# Patient Record
Sex: Male | Born: 1970 | Race: White | Hispanic: No | Smoking: Former smoker
Health system: Southern US, Community
[De-identification: ages and names within clinical notes are randomized; demographics above are authoritative.]

## PROBLEM LIST (undated history)

## (undated) DIAGNOSIS — M109 Gout, unspecified: Secondary | ICD-10-CM

## (undated) HISTORY — DX: Gout, unspecified: M10.9

## (undated) HISTORY — PX: VASECTOMY: SHX75

---

## 2000-09-18 ENCOUNTER — Emergency Department (HOSPITAL_COMMUNITY): Admission: EM | Admit: 2000-09-18 | Discharge: 2000-09-18 | Payer: Self-pay | Admitting: Emergency Medicine

## 2000-10-03 ENCOUNTER — Emergency Department (HOSPITAL_COMMUNITY): Admission: EM | Admit: 2000-10-03 | Discharge: 2000-10-03 | Payer: Self-pay | Admitting: Emergency Medicine

## 2011-10-07 ENCOUNTER — Ambulatory Visit (INDEPENDENT_AMBULATORY_CARE_PROVIDER_SITE_OTHER): Payer: Managed Care, Other (non HMO) | Admitting: Emergency Medicine

## 2011-10-07 ENCOUNTER — Other Ambulatory Visit: Payer: Self-pay | Admitting: *Deleted

## 2011-10-07 VITALS — BP 118/78 | HR 84 | Temp 98.7°F | Resp 16 | Ht 70.5 in | Wt 190.2 lb

## 2011-10-07 DIAGNOSIS — M109 Gout, unspecified: Secondary | ICD-10-CM

## 2011-10-07 MED ORDER — COLCHICINE 0.6 MG PO TABS: 0.6000 mg | ORAL_TABLET | Freq: Every day | ORAL | Status: DC

## 2011-10-07 NOTE — Progress Notes (Signed)
   Patient Name: Mitchell Reed Date of Birth: Nov 12, 1970 Medical Record Number: [id number] Gender: male Date of Encounter: 10/07/2011  Chief Complaint: Gout   History of Present Illness:  Mitchell Reed is a 41 y.o. very pleasant male patient who presents with the following:  Long history of gout.  No history of injury or overuse.  Pain in left knee for past three months.  Swollen and warm. Pain with ambulation and flexion.  Denies other joint pain.  Currently off meds.  There is no problem list on file for this patient.  No past medical history on file. No past surgical history on file. History  Substance Use Topics  . Smoking status: Former Games developer  . Smokeless tobacco: Not on file  . Alcohol Use: Not on file   No family history on file. No Known Allergies  Medication list has been reviewed and updated.  No current outpatient prescriptions on file prior to visit.    Review of Systems:   GEN: WDWN, NAD, Non-toxic, Alert & Oriented x 3 HEENT: Atraumatic, Normocephalic.  Ears and Nose: No external deformity. EXTR: No clubbing/cyanosis/edema NEURO: Normal gait.  PSYCH: Normally interactive. Conversant. Not depressed or anxious appearing.  Calm demeanor.  Knee:   Warm and tender, guards full extension and flexion.  Antalgic limp.  Physical Examination: Filed Vitals:   10/07/11 1227  BP: 118/78  Pulse: 84  Temp: 98.7 F (37.1 C)  Resp: 16   Filed Vitals:   10/07/11 1227  Height: 5' 10.5" (1.791 m)  Weight: 190 lb 3.2 oz (86.274 kg)   Body mass index is 26.91 kg/(m^2). Ideal Body Weight: Weight in (lb) to have BMI = 25: 176.4   As per HPI, otherwise negative.    EKG / Labs / Xrays: None available at time of encounter  Assessment and Plan: Exacerbation gout Follow up as needed  RX colcrys  Carmelina Dane, MD

## 2011-10-07 NOTE — Patient Instructions (Signed)
Gout Gout is an inflammatory condition (arthritis) caused by a buildup of uric acid crystals in the joints. Uric acid is a chemical that is normally present in the blood. Under some circumstances, uric acid can form into crystals in your joints. This causes joint redness, soreness, and swelling (inflammation). Repeat attacks are common. Over time, uric acid crystals can form into masses (tophi) near a joint, causing disfigurement. Gout is treatable and often preventable. CAUSES  The disease begins with elevated levels of uric acid in the blood. Uric acid is produced by your body when it breaks down a naturally found substance called purines. This also happens when you eat certain foods such as meats and fish. Causes of an elevated uric acid level include:  Being passed down from parent to child (heredity).   Diseases that cause increased uric acid production (obesity, psoriasis, some cancers).   Excessive alcohol use.   Diet, especially diets rich in meat and seafood.   Medicines, including certain cancer-fighting drugs (chemotherapy), diuretics, and aspirin.   Chronic kidney disease. The kidneys are no longer able to remove uric acid well.   Problems with metabolism.  Conditions strongly associated with gout include:  Obesity.   High blood pressure.   High cholesterol.   Diabetes.  Not everyone with elevated uric acid levels gets gout. It is not understood why some people get gout and others do not. Surgery, joint injury, and eating too much of certain foods are some of the factors that can lead to gout. SYMPTOMS   An attack of gout comes on quickly. It causes intense pain with redness, swelling, and warmth in a joint.   Fever can occur.   Often, only one joint is involved. Certain joints are more commonly involved:   Base of the big toe.   Knee.   Ankle.   Wrist.   Finger.  Without treatment, an attack usually goes away in a few days to weeks. Between attacks, you  usually will not have symptoms, which is different from many other forms of arthritis. DIAGNOSIS  Your caregiver will suspect gout based on your symptoms and exam. Removal of fluid from the joint (arthrocentesis) is done to check for uric acid crystals. Your caregiver will give you a medicine that numbs the area (local anesthetic) and use a needle to remove joint fluid for exam. Gout is confirmed when uric acid crystals are seen in joint fluid, using a special microscope. Sometimes, blood, urine, and X-ray tests are also used. TREATMENT  There are 2 phases to gout treatment: treating the sudden onset (acute) attack and preventing attacks (prophylaxis). Treatment of an Acute Attack  Medicines are used. These include anti-inflammatory medicines or steroid medicines.   An injection of steroid medicine into the affected joint is sometimes necessary.   The painful joint is rested. Movement can worsen the arthritis.   You may use warm or cold treatments on painful joints, depending which works best for you.   Discuss the use of coffee, vitamin C, or cherries with your caregiver. These may be helpful treatment options.  Treatment to Prevent Attacks After the acute attack subsides, your caregiver may advise prophylactic medicine. These medicines either help your kidneys eliminate uric acid from your body or decrease your uric acid production. You may need to stay on these medicines for a very long time. The early phase of treatment with prophylactic medicine can be associated with an increase in acute gout attacks. For this reason, during the first few months   of treatment, your caregiver may also advise you to take medicines usually used for acute gout treatment. Be sure you understand your caregiver's directions. You should also discuss dietary treatment with your caregiver. Certain foods such as meats and fish can increase uric acid levels. Other foods such as dairy can decrease levels. Your caregiver  can give you a list of foods to avoid. HOME CARE INSTRUCTIONS   Do not take aspirin to relieve pain. This raises uric acid levels.   Only take over-the-counter or prescription medicines for pain, discomfort, or fever as directed by your caregiver.   Rest the joint as much as possible. When in bed, keep sheets and blankets off painful areas.   Keep the affected joint raised (elevated).   Use crutches if the painful joint is in your leg.   Drink enough water and fluids to keep your urine clear or pale yellow. This helps your body get rid of uric acid. Do not drink alcoholic beverages. They slow the passage of uric acid.   Follow your caregiver's dietary instructions. Pay careful attention to the amount of protein you eat. Your daily diet should emphasize fruits, vegetables, whole grains, and fat-free or low-fat milk products.   Maintain a healthy body weight.  SEEK MEDICAL CARE IF:   You have an oral temperature above 102 F (38.9 C).   You develop diarrhea, vomiting, or any side effects from medicines.   You do not feel better in 24 hours, or you are getting worse.  SEEK IMMEDIATE MEDICAL CARE IF:   Your joint becomes suddenly more tender and you have:   Chills.   An oral temperature above 102 F (38.9 C), not controlled by medicine.  MAKE SURE YOU:   Understand these instructions.   Will watch your condition.   Will get help right away if you are not doing well or get worse.  Document Released: 03/17/2000 Document Revised: 03/09/2011 Document Reviewed: 06/28/2009 [employer] Patient Information 2012 [employer]. 

## 2012-01-15 ENCOUNTER — Telehealth: Payer: Self-pay

## 2012-01-15 DIAGNOSIS — M109 Gout, unspecified: Secondary | ICD-10-CM

## 2012-01-15 MED ORDER — COLCHICINE 0.6 MG PO TABS: 0.6000 mg | ORAL_TABLET | Freq: Every day | ORAL | Status: DC

## 2012-01-15 NOTE — Telephone Encounter (Signed)
Pt notified that rx was sent in 

## 2012-01-15 NOTE — Telephone Encounter (Signed)
Refill sent in

## 2012-01-15 NOTE — Telephone Encounter (Signed)
Pt needs RF of colchicine please

## 2012-03-06 ENCOUNTER — Encounter: Payer: Self-pay | Admitting: Family Medicine

## 2012-03-06 ENCOUNTER — Ambulatory Visit (INDEPENDENT_AMBULATORY_CARE_PROVIDER_SITE_OTHER): Payer: Managed Care, Other (non HMO) | Admitting: Family Medicine

## 2012-03-06 VITALS — BP 132/88 | HR 96 | Temp 99.2°F | Resp 16 | Ht 70.0 in | Wt 193.4 lb

## 2012-03-06 DIAGNOSIS — L02419 Cutaneous abscess of limb, unspecified: Secondary | ICD-10-CM

## 2012-03-06 DIAGNOSIS — M79606 Pain in leg, unspecified: Secondary | ICD-10-CM

## 2012-03-06 DIAGNOSIS — L02416 Cutaneous abscess of left lower limb: Secondary | ICD-10-CM

## 2012-03-06 DIAGNOSIS — M79609 Pain in unspecified limb: Secondary | ICD-10-CM

## 2012-03-06 MED ORDER — DOXYCYCLINE HYCLATE 100 MG PO TABS: 100.0000 mg | ORAL_TABLET | Freq: Two times a day (BID) | ORAL | Status: DC

## 2012-03-06 NOTE — Progress Notes (Signed)
Procedure Note: Verbal consent obtained.  Local anesthesia with 3 cc 2% lidocaine.  Sterile prep and drape.  Incision with 11 blade.  Moderate amount of purulence expressed.  Wound irrigated with remaining anesthetic.  Packed with 1/4" inch plain packing.  Cleansed and dressed.  Pt tolerated well.

## 2012-03-06 NOTE — Progress Notes (Signed)
41 yo man with abscess on left thigh, now draining bloody fluid.  He has three daughters who have also had abscesses.  Objective:  NAD Swollen 6 cm medial mid left thigh with intense erythema and opening centrally with thick yellow pus which was cultured.  2/3 of medial thigh is mildly erythematous in a circumferential distribution around the abscess.  Assessment: Large left thigh abscess with surrounding cellulitis. Most likely this represents MRSA infection.  Plan: 1. Abscess of left thigh  Wound culture, doxycycline (VIBRA-TABS) 100 MG tablet  2. Leg pain

## 2012-03-08 ENCOUNTER — Ambulatory Visit (INDEPENDENT_AMBULATORY_CARE_PROVIDER_SITE_OTHER): Payer: Managed Care, Other (non HMO) | Admitting: Physician Assistant

## 2012-03-08 VITALS — BP 110/74 | HR 84 | Temp 98.5°F | Resp 16 | Ht 72.0 in | Wt 188.0 lb

## 2012-03-08 DIAGNOSIS — M109 Gout, unspecified: Secondary | ICD-10-CM | POA: Insufficient documentation

## 2012-03-08 DIAGNOSIS — IMO0002 Reserved for concepts with insufficient information to code with codable children: Secondary | ICD-10-CM

## 2012-03-08 DIAGNOSIS — L03119 Cellulitis of unspecified part of limb: Secondary | ICD-10-CM

## 2012-03-08 NOTE — Patient Instructions (Signed)
Continue the antibiotic and warm compresses to the area for 15-20 minutes 2-3 times daily.  Change the dressing if it becomes saturated or leaks.  Drink at least 64 ounces of water daily. Consider a humidifier for the room where you sleep. Bathe once daily. Avoid using HOT water, as it dries skin. Avoid deodorant soaps (Dial is the worst!) and stick with gentle cleansers (I like Cetaphil Liquid Cleanser). After bathing, dry off completely, then apply a thick emollient cream (I like Cetaphil Moisturizing Cream). Apply the cream twice daily, or more!

## 2012-03-08 NOTE — Progress Notes (Signed)
  Subjective:    Patient ID: Mitchell Reed, male    DOB: 07-Mar-1971, 41 y.o.   MRN: [id number]  HPI This 41 y.o. male presents for evaluation of cellulitis/abscess of the upper/inner thigh, S/P I&D on 03/06/2012.  Feels A LOT better since then.  "I can actually walk and stuff now."  No fever, chills.  Tolerating Doxy without difficulty.  He's very anxious about the wound care today, as it was very painful to have the initial I&D.  Review of Systems As above.    Objective:   Physical Exam BP 110/74  Pulse 84  Temp 98.5 F (36.9 C) (Oral)  Resp 16  Ht 6' (1.829 m)  Wt 188 lb (85.276 kg)  BMI 25.50 kg/m2  SpO2 99% WDWNWM, A&O x 3. Wound on the left inner upper thigh with packing.  Surrounding erythema and tenderness, though less than at his previous visit.  Packing removed.  No additional purulence expressed.  Large amount of necrotic tissue removed with forceps after irrigation of the wound with 2% lidocaine plain.  The wound cavity was then gently repacked with 1/2 inch plain packing and dressed.    Assessment & Plan:   1. Abscess or cellulitis of thigh    Patient Instructions  Continue the antibiotic and warm compresses to the area for 15-20 minutes 2-3 times daily.  Change the dressing if it becomes saturated or leaks.

## 2012-03-09 ENCOUNTER — Ambulatory Visit (INDEPENDENT_AMBULATORY_CARE_PROVIDER_SITE_OTHER): Payer: Managed Care, Other (non HMO) | Admitting: Physician Assistant

## 2012-03-09 VITALS — BP 120/78 | HR 90 | Temp 97.7°F | Resp 16 | Ht 71.0 in | Wt 187.0 lb

## 2012-03-09 DIAGNOSIS — L02419 Cutaneous abscess of limb, unspecified: Secondary | ICD-10-CM

## 2012-03-09 LAB — WOUND CULTURE

## 2012-03-09 NOTE — Progress Notes (Signed)
   Patient ID: Carzell Saldivar MRN: [id number], DOB: 29-Dec-1970 41 y.o. Date of Encounter: 03/09/2012, 10:22 AM  Primary Physician: No primary provider on file.  Chief Complaint: Wound care   See previous note  HPI: 41 y.o. y/o male presents for wound care s/p I&D on 03/06/12 Doing well No issues or complaints Afebrile/ no chills No nausea or vomiting Tolerating Doxycyline Pain much improved Daily dressing change Previous note reviewed  Past Medical History  Diagnosis Date  . Gout      Home Meds: Prior to Admission medications   Medication Sig Start Date End Date Taking? Authorizing Provider  colchicine 0.6 MG tablet Take 1 tablet (0.6 mg total) by mouth daily. Take two tablets now, repeat one tablet in one hour.  Tomorrow one daily. 01/15/12 01/14/13 Yes Deveon Kisiel M Karstyn Birkey, PA-C  doxycycline (VIBRA-TABS) 100 MG tablet Take 1 tablet (100 mg total) by mouth 2 (two) times daily. 03/06/12  Yes Elvina Sidle, MD    Allergies: No Known Allergies  ROS: Constitutional: Afebrile, no chills Cardiovascular: negative for chest pain or palpitations Dermatological: Positive for wound. Negative for pain, or warmth  GI: No nausea or vomiting   EXAM: Physical Exam: Blood pressure 120/78, pulse 90, temperature 97.7 F (36.5 C), resp. rate 16, height 5\' 11"  (1.803 m), weight 187 lb (84.823 kg), SpO2 100.00%., Body mass index is 26.08 kg/(m^2). General: Well developed, well nourished, in no acute distress. Nontoxic appearing. Head: Normocephalic, atraumatic, sclera non-icteric.  Neck: Supple. Lungs: Breathing is unlabored. Heart: Normal rate. Skin:  Warm and moist. Dressing and packing in place. Mild induration and erythema. No tenderness to palpation. Neuro: Alert and oriented X 3. Moves all extremities spontaneously. Normal gait.  Psych:  Responds to questions appropriately with a normal affect.   PROCEDURE: Dressing and packing removed. No purulence expressed Wound bed healthy Irrigated  with 1% plain lidocaine 5 cc. Repacked with 1/4 inch plain packing Dressing applied  LAB: Culture: MRSA sensitive to Doxycycline.  A/P: 41 y.o. y/o male with cellulitis/abscess as above s/p I&D on 03/06/12 -Wound care per above -Continue Doxycycline -Notified patient of culture results -Luke warm showers -Do not reuse towels or rags -Lotion -Pain well controlled -Daily dressing changes -Recheck 48 hours  Signed, Eula Listen, PA-C 03/09/2012 10:22 AM

## 2012-03-11 ENCOUNTER — Ambulatory Visit (INDEPENDENT_AMBULATORY_CARE_PROVIDER_SITE_OTHER): Payer: Managed Care, Other (non HMO) | Admitting: Physician Assistant

## 2012-03-11 VITALS — BP 120/81 | HR 105 | Temp 98.5°F | Resp 16

## 2012-03-11 DIAGNOSIS — L02419 Cutaneous abscess of limb, unspecified: Secondary | ICD-10-CM

## 2012-03-11 DIAGNOSIS — L03119 Cellulitis of unspecified part of limb: Secondary | ICD-10-CM

## 2012-03-11 NOTE — Progress Notes (Signed)
  Subjective:    Patient ID: Mitchell Reed, male    DOB: [birth year], 41 y.o.   MRN: [id number]  HPI  This 41 y.o. male presents for evaluation of cellulitis/abscess of the upper/inner thigh, S/P I&D on 03/06/2012, with wound care on 12/06, 12/07.  Continues to improve.  No pain now, just itching.   No fever, chills.  Tolerating Doxy without difficulty.    Review of Systems As above.    Objective:   Physical Exam BP 120/81  Pulse 105  Temp 98.5 F (36.9 C) (Oral)  Resp 16  SpO2 98%  Wound on the left inner upper thigh with packing.  Surrounding erythema and induration are minimal. Packing removed.  No additional purulence expressed.  Irrigation of the wound with 2% lidocaine plain.  The wound cavity was then gently repacked with 1/4 inch plain packing and dressed.    Assessment & Plan:   1. Cellulitis and abscess of leg    RTC 48 hours.  Patient Instructions  Continue the antibiotic and warm compresses to the area for 15-20 minutes 2-3 times daily.  Change the dressing if it becomes saturated or leaks.

## 2012-03-13 ENCOUNTER — Ambulatory Visit (INDEPENDENT_AMBULATORY_CARE_PROVIDER_SITE_OTHER): Payer: Managed Care, Other (non HMO) | Admitting: Physician Assistant

## 2012-03-13 VITALS — BP 131/86 | HR 114 | Temp 98.2°F | Resp 16 | Ht 71.0 in | Wt 184.0 lb

## 2012-03-13 DIAGNOSIS — L02419 Cutaneous abscess of limb, unspecified: Secondary | ICD-10-CM

## 2012-03-13 NOTE — Patient Instructions (Signed)
Complete the antibiotic.  Change the dressing daily, and as needed.  You should wash the wound daily with soap and water, while you are in the bath or shower is fine.  You may apply a thin layer of antibiotic ointment on the area if you choose.

## 2012-03-13 NOTE — Progress Notes (Signed)
  Subjective:    Patient ID: Mitchell Reed, male    DOB: 03/18/1971, 41 y.o.   MRN: [id number]  HPI  This 41 y.o. male presents for evaluation of cellulitis/abscess of the upper/inner thigh, S/P I&D on 03/06/2012, with wound care on 12/06, 12/07 and 12/09.  Continues to improve.  No pain now, just itching.   No fever, chills.  Tolerating Doxy without difficulty.    Review of Systems As above.    Objective:   Physical Exam BP 131/86  Pulse 114  Temp 98.2 F (36.8 C) (Oral)  Resp 16  Ht 5\' 11"  (1.803 m)  Wt 184 lb (83.462 kg)  BMI 25.66 kg/m2  SpO2 98%  Wound on the left inner upper thigh with packing.  No erythema. Surrounding induration is minimal. Packing removed.  No additional purulence expressed.  The wound cavity is very shallow and is granulating well. Bandage applied.    Assessment & Plan:   1. Cellulitis and abscess of leg    Patient Instructions  Complete the antibiotic.  Change the dressing daily, and as needed.  You should wash the wound daily with soap and water, while you are in the bath or shower is fine.  You may apply a thin layer of antibiotic ointment on the area if you choose.   RTC PRN.

## 2012-11-25 ENCOUNTER — Other Ambulatory Visit: Payer: Self-pay | Admitting: Physician Assistant

## 2013-02-23 ENCOUNTER — Other Ambulatory Visit: Payer: Self-pay | Admitting: Physician Assistant

## 2013-07-04 ENCOUNTER — Ambulatory Visit (INDEPENDENT_AMBULATORY_CARE_PROVIDER_SITE_OTHER): Payer: BC Managed Care – PPO | Admitting: Family Medicine

## 2013-07-04 ENCOUNTER — Encounter: Payer: Self-pay | Admitting: Family Medicine

## 2013-07-04 VITALS — BP 120/80 | HR 106 | Temp 98.1°F | Resp 16 | Ht 71.0 in | Wt 188.0 lb

## 2013-07-04 DIAGNOSIS — M109 Gout, unspecified: Secondary | ICD-10-CM

## 2013-07-04 LAB — COMPLETE METABOLIC PANEL WITH GFR
ALK PHOS: 72 U/L (ref 39–117)
ALT: 32 U/L (ref 0–53)
AST: 26 U/L (ref 0–37)
Albumin: 4.2 g/dL (ref 3.5–5.2)
BILIRUBIN TOTAL: 0.6 mg/dL (ref 0.2–1.2)
BUN: 8 mg/dL (ref 6–23)
CO2: 22 mEq/L (ref 19–32)
Calcium: 9.5 mg/dL (ref 8.4–10.5)
Chloride: 104 mEq/L (ref 96–112)
Creat: 0.98 mg/dL (ref 0.50–1.35)
GFR, Est African American: >89 mL/min
GLUCOSE: 122 mg/dL — AB (ref 70–99)
Potassium: 3.6 mEq/L (ref 3.5–5.3)
SODIUM: 139 meq/L (ref 135–145)
TOTAL PROTEIN: 7.3 g/dL (ref 6.0–8.3)

## 2013-07-04 LAB — URIC ACID: URIC ACID, SERUM: 7.5 mg/dL (ref 4.0–7.8)

## 2013-07-04 MED ORDER — PREDNISONE 10 MG PO TABS: 10.0000 mg | ORAL_TABLET | Freq: Every day | ORAL | Status: DC

## 2013-07-04 NOTE — Patient Instructions (Addendum)
Gout Gout is an inflammatory arthritis caused by a buildup of uric acid crystals in the joints. Uric acid is a chemical that is normally present in the blood. When the level of uric acid in the blood is too high it can form crystals that deposit in your joints and tissues. This causes joint redness, soreness, and swelling (inflammation). Repeat attacks are common. Over time, uric acid crystals can form into masses (tophi) near a joint, destroying bone and causing disfigurement. Gout is treatable and often preventable. CAUSES  The disease begins with elevated levels of uric acid in the blood. Uric acid is produced by your body when it breaks down a naturally found substance called purines. Certain foods you eat, such as meats and fish, contain high amounts of purines. Causes of an elevated uric acid level include:  Being passed down from parent to child (heredity).  Diseases that cause increased uric acid production (such as obesity, psoriasis, and certain cancers).  Excessive alcohol use.  Diet, especially diets rich in meat and seafood.  Medicines, including certain cancer-fighting medicines (chemotherapy), water pills (diuretics), and aspirin.  Chronic kidney disease. The kidneys are no longer able to remove uric acid well.  Problems with metabolism. Conditions strongly associated with gout include:  Obesity.  High blood pressure.  High cholesterol.  Diabetes. Not everyone with elevated uric acid levels gets gout. It is not understood why some people get gout and others do not. Surgery, joint injury, and eating too much of certain foods are some of the factors that can lead to gout attacks. SYMPTOMS   An attack of gout comes on quickly. It causes intense pain with redness, swelling, and warmth in a joint.  Fever can occur.  Often, only one joint is involved. Certain joints are more commonly involved:  Base of the big toe.  Knee.  Ankle.  Wrist.  Finger. Without  treatment, an attack usually goes away in a few days to weeks. Between attacks, you usually will not have symptoms, which is different from many other forms of arthritis. DIAGNOSIS  Your caregiver will suspect gout based on your symptoms and exam. In some cases, tests may be recommended. The tests may include:  Blood tests.  Urine tests.  X-rays.  Joint fluid exam. This exam requires a needle to remove fluid from the joint (arthrocentesis). Using a microscope, gout is confirmed when uric acid crystals are seen in the joint fluid. TREATMENT  There are two phases to gout treatment: treating the sudden onset (acute) attack and preventing attacks (prophylaxis).  Treatment of an Acute Attack.  Medicines are used. These include anti-inflammatory medicines or steroid medicines.  An injection of steroid medicine into the affected joint is sometimes necessary.  The painful joint is rested. Movement can worsen the arthritis.  You may use warm or cold treatments on painful joints, depending which works best for you.  Treatment to Prevent Attacks.  If you suffer from frequent gout attacks, your caregiver may advise preventive medicine. These medicines are started after the acute attack subsides. These medicines either help your kidneys eliminate uric acid from your body or decrease your uric acid production. You may need to stay on these medicines for a very long time.  The early phase of treatment with preventive medicine can be associated with an increase in acute gout attacks. For this reason, during the first few months of treatment, your caregiver may also advise you to take medicines usually used for acute gout treatment. Be sure you   understand your caregiver's directions. Your caregiver may make several adjustments to your medicine dose before these medicines are effective.  Discuss dietary treatment with your caregiver or dietitian. Alcohol and drinks high in sugar and fructose and foods  such as meat, poultry, and seafood can increase uric acid levels. Your caregiver or dietician can advise you on drinks and foods that should be limited. HOME CARE INSTRUCTIONS   Do not take aspirin to relieve pain. This raises uric acid levels.  Only take over-the-counter or prescription medicines for pain, discomfort, or fever as directed by your caregiver.  Rest the joint as much as possible. When in bed, keep sheets and blankets off painful areas.  Keep the affected joint raised (elevated).  Apply warm or cold treatments to painful joints. Use of warm or cold treatments depends on which works best for you.  Use crutches if the painful joint is in your leg.  Drink enough fluids to keep your urine clear or pale yellow. This helps your body get rid of uric acid. Limit alcohol, sugary drinks, and fructose drinks.  Follow your dietary instructions. Pay careful attention to the amount of protein you eat. Your daily diet should emphasize fruits, vegetables, whole grains, and fat-free or low-fat milk products. Discuss the use of coffee, vitamin C, and cherries with your caregiver or dietician. These may be helpful in lowering uric acid levels.  Maintain a healthy body weight. SEEK MEDICAL CARE IF:   You develop diarrhea, vomiting, or any side effects from medicines.  You do not feel better in 24 hours, or you are getting worse. SEEK IMMEDIATE MEDICAL CARE IF:   Your joint becomes suddenly more tender, and you have chills or a fever. MAKE SURE YOU:   Understand these instructions.  Will watch your condition.  Will get help right away if you are not doing well or get worse. Document Released: 03/17/2000 Document Revised: 07/15/2012 Document Reviewed: 11/01/2011 [facility] Patient Information 2014 [employer].     PREDNISONE tablets-  Take as follows- Start with 2 tablets 3 times a day on Day 1 then take 1 less tablet every day until you have no tablets remaining.

## 2013-07-04 NOTE — Progress Notes (Signed)
S:  This 43 y.o. Cauc male is here for evaluation of painful, swollen L knee, onset 3 days ago after eating Manwich sandwiches for dinner. He has gout but had run out of Allopurinol.  He was seen at [facility] 2 days ago; Colcrys was prescribed and knee is much better. He has refill for Allopurinol but is not taking that at this time. He is able to bear weight and redness and is pain- free. He cannot recall lat time he had uric acid level checked. He denies alcohol consumption.  Patient Active Problem List   Diagnosis Date Noted  . Gout    PMHx, Surg Hx, Soc and Fam Hx reviewed.  MEDICATIONS reconciled.  ROS: As per HPI; otherwise noncontributory.  O: Filed Vitals:   07/04/13 1329  BP: 120/80  Pulse: 106  Temp: 98.1 F (36.7 C)  Resp: 16   GEN: In NAD; WN,WD. HENT: [place]/AT; EOMI w/ clear conj/sclerae. Otherwise unremarkable. SKIN: W&D; intact w/o diaphoresis, erythema or rashes. MS: L knee- decreased ROM (flexion to 90 degrees w/ full extension). Swelling noted w/ fullness in popliteal space. Patella is not ballotable. No warmth or erythema around joint. NEURO: A&O x 3; CNs intact. Nonfocal.  A/P: Gout - Plan: COMPLETE METABOLIC PANEL WITH GFR, Uric acid Continue with Colcrys daily; Prednisone taper over 6 days (Take 6 tabs divided doses with meals on day 1; take 1 less tablet each day thereafter. Take all doses w/ food).

## 2013-07-05 NOTE — Progress Notes (Signed)
Quick Note:  Please advise pt regarding following labs... Normal labs except blood sugar above normal. This needs to be rechecked at future visit with a Diabetes screening test. Uric acid (gout blood test) is in the high-normal range.  Once the swelling in your knee is gone, you can stop the Colcrys (colchicine) and start back on the Allopurinol to prevent a gout attack. Also watch your diet as instructed.  Copy to pt. ______

## 2013-07-07 ENCOUNTER — Encounter: Payer: Self-pay | Admitting: Family Medicine

## 2013-08-01 ENCOUNTER — Ambulatory Visit (INDEPENDENT_AMBULATORY_CARE_PROVIDER_SITE_OTHER): Payer: BC Managed Care – PPO | Admitting: Family Medicine

## 2013-08-01 ENCOUNTER — Other Ambulatory Visit: Payer: Self-pay | Admitting: Family Medicine

## 2013-08-01 ENCOUNTER — Ambulatory Visit: Payer: BC Managed Care – PPO

## 2013-08-01 ENCOUNTER — Encounter: Payer: Self-pay | Admitting: Family Medicine

## 2013-08-01 VITALS — BP 117/77 | HR 92 | Temp 97.3°F | Resp 16 | Ht 71.0 in | Wt 194.0 lb

## 2013-08-01 DIAGNOSIS — M25569 Pain in unspecified knee: Secondary | ICD-10-CM

## 2013-08-01 DIAGNOSIS — M25562 Pain in left knee: Secondary | ICD-10-CM

## 2013-08-01 DIAGNOSIS — M109 Gout, unspecified: Secondary | ICD-10-CM

## 2013-08-01 MED ORDER — PREDNISONE 10 MG PO TABS: 10.0000 mg | ORAL_TABLET | Freq: Every day | ORAL | Status: DC

## 2013-08-01 NOTE — Patient Instructions (Addendum)
PREDNISONE- Take as directed.  Take 1 tablet 3 times a days for 5 days.                                                          Take 1 tablet 2 times a day for 7 days.                                                          Take 1 tablet daily for 7 days.                                                          Take 1/2 tablet daily until no tablets remain.  TAKE all doses with food or snack.  Take colchicine until swelling and pain are gone.  Once the swelling has gone, you can resume Allopurinol.

## 2013-08-03 ENCOUNTER — Encounter: Payer: Self-pay | Admitting: Family Medicine

## 2013-08-03 NOTE — Progress Notes (Addendum)
S:  This 43 y.o. 1 male returns for follow-up re: knee pain. He has gout w/ flare about 1 month ago. He responded well to Prednisone w/ complete resolution of pain. He was pain free for 3-4 days, able to work and perform other activities pain. Knee swelling recurred 7-10 days ago. Pt resumed colchicine. Knee is less swollen and painful compared to last time; it feels stiff w/ decreased ROM. Pt denies consuming any food or beverage to bring on flare-up.  PMHx, Surg Hx, Soc and Fam Hx reviewed.  ROS: As per HPI; otherwise unremarkable.  O: Filed Vitals:   08/01/13 1449  BP: 117/77  Pulse: 92  Temp: 97.3 F (36.3 C)  Resp: 16   GEN: In NAD; WN,WD. SKIN: W&D; intact w/o erythema, rash or pallor. MS: L knee- warm to touch w/ effusion superior and medical to patella (not ballotable). ROM is good > 90 degrees. NEURO: A&O x 3; CNs intact. Gait- pt has slight limp favoring L leg.  [facility] reading (PRIMARY) by  Dr. Leward Quan: L knee- normal; no fracture or dislocation.   A/P: Knee pain, left - Plan: DG Knee 1-2 Views Left  Gout of knee- Repeat Prednisone for longer course as directed = 20+ days.  Pt understands.

## 2013-09-17 ENCOUNTER — Telehealth: Payer: Self-pay

## 2013-09-17 DIAGNOSIS — G8929 Other chronic pain: Secondary | ICD-10-CM

## 2013-09-17 DIAGNOSIS — M25562 Pain in left knee: Principal | ICD-10-CM

## 2013-09-17 NOTE — Telephone Encounter (Signed)
Please advise 

## 2013-09-17 NOTE — Telephone Encounter (Signed)
PT STATES HE HAVE BEEN SEEING DR MCPHERSON TWICE FOR HIS KNEE AND IT ISN'T GETTING ANY BETTER WOULD LIKE TO BE REFERRED. PLEASE CALL [phone]

## 2013-09-17 NOTE — Telephone Encounter (Signed)
Pt has chronic knee pain; last OV here was 08/01/13. Prednisone helpful but temporary. Knee brace helps but knee feels like it is going to give out intermittently. Pt agrees to  [facility] eval but leaving town Saturday September 20, 2013. Will return September 27, 2013. Will not be available until September 29, 2013. Pt prefers appt be on a Friday. I will order referral to Dr. Lara Mulch.

## 2013-09-18 ENCOUNTER — Telehealth: Payer: Self-pay

## 2013-09-18 NOTE — Telephone Encounter (Signed)
Pt was retuning a missed call, but he stated that there was no message left for him. I advised him of the last message in the system for him;                  "Pt has chronic knee pain; last OV here was 08/01/13. Prednisone helpful but temporary. Knee brace helps but knee feels like it is going to give out intermittently. Pt agrees to [facility] eval but leaving town Saturday                                                 September 20, 2013. Will return September 27, 2013. Will not be available until September 29, 2013. Pt prefers appt be on a Friday. I will order referral to Dr. Lara Mulch." Please advice if needed.

## 2013-10-10 ENCOUNTER — Other Ambulatory Visit: Payer: Self-pay | Admitting: Orthopedic Surgery

## 2013-10-10 DIAGNOSIS — M25562 Pain in left knee: Secondary | ICD-10-CM

## 2013-10-16 ENCOUNTER — Ambulatory Visit
Admission: RE | Admit: 2013-10-16 | Discharge: 2013-10-16 | Disposition: A | Discharge status: Home / Self Care | Payer: BC Managed Care – PPO | Source: Ambulatory Visit | Attending: Orthopedic Surgery | Admitting: Orthopedic Surgery

## 2013-10-16 DIAGNOSIS — M25562 Pain in left knee: Secondary | ICD-10-CM

## 2014-01-09 ENCOUNTER — Ambulatory Visit (INDEPENDENT_AMBULATORY_CARE_PROVIDER_SITE_OTHER): Payer: BC Managed Care – PPO

## 2014-01-09 ENCOUNTER — Encounter: Payer: Self-pay | Admitting: Family Medicine

## 2014-01-09 ENCOUNTER — Ambulatory Visit (INDEPENDENT_AMBULATORY_CARE_PROVIDER_SITE_OTHER): Payer: BC Managed Care – PPO | Admitting: Family Medicine

## 2014-01-09 VITALS — BP 122/80 | HR 94 | Temp 97.7°F | Resp 16 | Wt 191.0 lb

## 2014-01-09 DIAGNOSIS — R21 Rash and other nonspecific skin eruption: Secondary | ICD-10-CM

## 2014-01-09 DIAGNOSIS — Z8639 Personal history of other endocrine, nutritional and metabolic disease: Secondary | ICD-10-CM

## 2014-01-09 DIAGNOSIS — M25561 Pain in right knee: Secondary | ICD-10-CM

## 2014-01-09 DIAGNOSIS — Z8739 Personal history of other diseases of the musculoskeletal system and connective tissue: Secondary | ICD-10-CM

## 2014-01-09 LAB — URIC ACID: Uric Acid, Serum: 8.6 mg/dL — ABNORMAL HIGH (ref 4.0–7.8)

## 2014-01-09 LAB — POCT CBC
GRANULOCYTE PERCENT: 67.9 % (ref 37–80)
HCT, POC: 49.7 % (ref 43.5–53.7)
Hemoglobin: 16.8 g/dL (ref 14.1–18.1)
Lymph, poc: 3.4 (ref 0.6–3.4)
MCH, POC: 30.2 pg (ref 27–31.2)
MCHC: 33.8 g/dL (ref 31.8–35.4)
MCV: 89.4 fL (ref 80–97)
MID (CBC): 0.5 (ref 0–0.9)
MPV: 7 fL (ref 0–99.8)
PLATELET COUNT, POC: 292 10*3/uL (ref 142–424)
POC Granulocyte: 8.4 — AB (ref 2–6.9)
POC LYMPH %: 27.8 % (ref 10–50)
POC MID %: 4.3 %M (ref 0–12)
RBC: 5.56 M/uL (ref 4.69–6.13)
RDW, POC: 12.7 %
WBC: 12.3 10*3/uL — AB (ref 4.6–10.2)

## 2014-01-09 MED ORDER — COLCHICINE 0.6 MG PO TABS: 0.6000 mg | ORAL_TABLET | Freq: Two times a day (BID) | ORAL | Status: DC

## 2014-01-09 MED ORDER — INDOMETHACIN 50 MG PO CAPS: 50.0000 mg | ORAL_CAPSULE | Freq: Three times a day (TID) | ORAL | Status: DC

## 2014-01-09 NOTE — Progress Notes (Signed)
Subjective:    Patient ID: Mitchell Reed, male    DOB: Mar 03, 1971, 43 y.o.   MRN: [id number]  HPI Patient presents to clinic for R knee pain and rash on L forearm. Rash has been present for 2 weeks and initially, looked like a bug bite now has grown in size and is red. Rash was improving and was lighter in color then became red again. Unsure of exact timeline. Rash itches and has had night sweats. Is not painful, warm to touch, and has not had fever. Denies doing recent yard work, being in [employer], or working in a shed. 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 Has tried neosporin with minimal relief.  Has R knee pain that has been present for 3 months. Swelling has increased and decreased over the past few months. Pain is currently 5/10, however, was 10/10 when he woke up. Ibuprofen has helped with pain moderately. Was taking Allopurinol daily until two days ago and then switched to Colchine. Was told to take one or the other in the event of a flare. Had leftover prednisone that helped, but ran out of that. Wears a brace on the affected side. Has missed work due to pain. Causes a limp, but denies any falls. No fevers, but has had night sweats. Always worse with in activity.  L knee experienced same sx in Feb. and cleared up after patient had MRI.   Review of Systems  Constitutional: Positive for activity change. Negative for fever, chills, diaphoresis and fatigue.  Respiratory: Negative for shortness of breath.   Cardiovascular: Negative for palpitations and leg swelling.  Gastrointestinal: Negative for nausea, vomiting, diarrhea and constipation.  Musculoskeletal: Positive for arthralgias (R knee), gait problem (limp) and joint swelling (R knee). Negative for myalgias.  Skin: Positive for rash (L forearm).  Neurological: Negative for dizziness, weakness, light-headedness, numbness and headaches.  Hematological: Negative for adenopathy.       Objective:   Physical Exam    Constitutional: He is oriented to person, place, and time. He appears well-developed and well-nourished. No distress.  HENT:  Head: Normocephalic and atraumatic.  Right Ear: External ear normal.  Left Ear: External ear normal.  Mouth/Throat: Oropharynx is clear and moist. No oropharyngeal exudate.  Eyes: Conjunctivae are normal. Pupils are equal, round, and reactive to light. Right eye exhibits no discharge. Left eye exhibits no discharge.  Neck: Neck supple. No thyromegaly present.  Cardiovascular: Normal rate, regular rhythm and normal heart sounds.  Exam reveals no gallop and no friction rub.   No murmur heard. Pulmonary/Chest: Effort normal and breath sounds normal. No respiratory distress. He has no rales.  Abdominal: Soft. Bowel sounds are normal. There is no tenderness.  Musculoskeletal: He exhibits edema and tenderness.       Right knee: He exhibits decreased range of motion, swelling, effusion and erythema. He exhibits no ecchymosis, no deformity and no LCL laxity. Tenderness found.       Left knee: Normal.  R knee crepitus  Lymphadenopathy:    He has no cervical adenopathy.  Neurological: He is alert and oriented to person, place, and time.  Skin: Skin is warm and dry. Rash noted. He is not diaphoretic. There is erythema.  Psychiatric: He has a normal mood and affect. His behavior is normal. Judgment and thought content normal.   Blood pressure 122/80, pulse 94, temperature 97.7 F (36.5 C), resp. rate 16, weight 191 lb (86.637 kg), SpO2 98.00%.  Results for orders  placed in visit on 01/09/14  POCT CBC      Result Value Ref Range   WBC 12.3 (*) 4.6 - 10.2 K/uL   Lymph, poc 3.4  0.6 - 3.4   POC LYMPH PERCENT 27.8  10 - 50 %L   MID (cbc) 0.5  0 - 0.9   POC MID % 4.3  0 - 12 %M   POC Granulocyte 8.4 (*) 2 - 6.9   Granulocyte percent 67.9  37 - 80 %G   RBC 5.56  4.69 - 6.13 M/uL   Hemoglobin 16.8  14.1 - 18.1 g/dL   HCT, POC 49.7  43.5 - 53.7 %   MCV 89.4  80 - 97 fL    MCH, POC 30.2  27 - 31.2 pg   MCHC 33.8  31.8 - 35.4 g/dL   RDW, POC 12.7     Platelet Count, POC 292  142 - 424 K/uL   MPV 7.0  0 - 99.8 fL    Procedure Note:  Consent was obtained. Betadine prepped area. Local anesthesia. 1 cc 2% lido. 20 G needle inserted. Knee joint aspirated and 50 cc fluid obtained. First 20 cc straw colored and last 30 blood tinged. Bandaged placed.      Assessment & Plan:  1. Knee pain, right 2. History of gout - DG Knee Complete 4 Views Right; Future: joint space narrowing and effusion. No fractures or dislocation.  - POCT CBC - POCT SEDIMENTATION RATE - GC/Chlamydia Probe Amp - Uric Acid - Indomethacin 50 mg TID for inflammation and pain for 5- 7 days. - Colcrys 0.6 BID with meals.   3. Rash -Encouraged to use OTC cortisone.   Alveta Heimlich PA-C  [facility] 01/09/2014 12:37 PM

## 2014-01-09 NOTE — Progress Notes (Signed)
S:  This 43 y.o. Cauc male has hx of gout, affecting knees; he has been seen at [facility] x 2 w/ L knee pain. He was evaluated by Dr. Ronnie Derby; MRI showed abnormalities. Pt reports that Dr.Lucey wanted to do surgery. Pt has not followed up with him.  Current issue as outlined in documentation prepared by T. Brewington, PA-C.  Re: Rash on L forearm- hx is not clear regarding night sweats and relation to lesion on arm. Pt reports sweating at night when he is having significant joint pain. He has a cat and a dog but denies scratches from weather pet (particularly the cat). No documented fever/chills reported.  Re: Gout- pt denies alcohol consumption and avoids foods that can cause a flare.  Patient Active Problem List   Diagnosis Date Noted  . Gout     MEDICATIONS reviewed.  ROS: As per PA-C note.  O: I agree with findings on physical exam.    SKIN: Facial erythema with few papulopustular lesions. Rash on L forearm- 1 cm well demarcated erythematous lesion; no surrounding satellite lesions or circular erythema.  Procedure performed by T. Brewington, PA-C with guidance of Bernestine Amass, PA-C. See procedure note. Pt much improved afterward.   [facility] reading (PRIMARY) by  Dr. Leward Quan: Large joint effusion w/ minimal spurring. No acute fracture or dislocation.   A/P:  As per T. Brewington, PA-C; I have discussed w/ her and agree.  Knee pain, right - Plan: DG Knee Complete 4 Views Right, POCT CBC, POCT SEDIMENTATION RATE, GC/Chlamydia Probe Amp, Uric Acid, Synovial Fluid Panel, colchicine (COLCRYS) 0.6 MG tablet  History of gout - Plan: POCT CBC, POCT SEDIMENTATION RATE, Uric Acid, Synovial Fluid Panel, colchicine (COLCRYS) 0.6 MG tablet  Rash and nonspecific skin eruption

## 2014-01-09 NOTE — Patient Instructions (Signed)
1. For rash, over the counter Cortisone for itch relief.

## 2014-01-10 LAB — GC/CHLAMYDIA PROBE AMP
CT PROBE, AMP APTIMA: NEGATIVE
GC PROBE AMP APTIMA: NEGATIVE

## 2014-01-12 LAB — SYNOVIAL FLUID PANEL
Crystals, Fluid: NONE SEEN
Eosinophils-Synovial: 0 % (ref 0–1)
Glucose, Synovial Fluid: 77 mg/dL
Lymphocytes-Synovial Fld: 5 % (ref 0–20)
Monocyte/Macrophage: 3 % — ABNORMAL LOW (ref 50–90)
Neutrophil, Synovial: 92 % — ABNORMAL HIGH (ref 0–25)
Protein, Synovial Fluid: 4.6 g/dL — ABNORMAL HIGH (ref 1.0–3.0)
WBC, Synovial: 5765 uL — ABNORMAL HIGH (ref 0–200)

## 2014-01-13 NOTE — Progress Notes (Signed)
Quick Note:  Please advise pt regarding following labs...  Uric acid level is elevated consistent with Gout. Fluid drawn from knee is not infected (no bacteria has grown in culture). Continue with current medications and keep follow-up appointment. ______

## 2014-01-14 ENCOUNTER — Other Ambulatory Visit: Payer: Self-pay | Admitting: Family Medicine

## 2014-01-14 DIAGNOSIS — Z8739 Personal history of other diseases of the musculoskeletal system and connective tissue: Secondary | ICD-10-CM

## 2014-01-14 DIAGNOSIS — M25561 Pain in right knee: Secondary | ICD-10-CM

## 2014-01-14 LAB — BODY FLUID CULTURE
Gram Stain: NONE SEEN
ORGANISM ID, BACTERIA: NO GROWTH

## 2014-01-14 MED ORDER — COLCHICINE 0.6 MG PO TABS: 0.6000 mg | ORAL_TABLET | Freq: Two times a day (BID) | ORAL | Status: DC

## 2014-01-23 ENCOUNTER — Ambulatory Visit (INDEPENDENT_AMBULATORY_CARE_PROVIDER_SITE_OTHER): Payer: BC Managed Care – PPO | Admitting: Family Medicine

## 2014-01-23 ENCOUNTER — Encounter: Payer: Self-pay | Admitting: Family Medicine

## 2014-01-23 VITALS — BP 102/68 | HR 119 | Temp 98.7°F | Resp 16 | Ht 70.5 in | Wt 189.6 lb

## 2014-01-23 DIAGNOSIS — M25561 Pain in right knee: Secondary | ICD-10-CM

## 2014-01-23 DIAGNOSIS — L989 Disorder of the skin and subcutaneous tissue, unspecified: Secondary | ICD-10-CM

## 2014-01-23 DIAGNOSIS — M1A0690 Idiopathic chronic gout, unspecified knee, without tophus (tophi): Secondary | ICD-10-CM

## 2014-01-23 MED ORDER — COLCHICINE 0.6 MG PO TABS: 0.6000 mg | ORAL_TABLET | Freq: Two times a day (BID) | ORAL | Status: DC

## 2014-01-23 MED ORDER — CLOBETASOL PROP EMOLLIENT BASE 0.05 % EX CREA: 1.0000 "application " | TOPICAL_CREAM | Freq: Two times a day (BID) | CUTANEOUS | Status: DC

## 2014-01-23 NOTE — Progress Notes (Signed)
S:  This 43 y.o. Cauc male has gout; he was seen 01/09/14 with painful swollen R knee with large effusion. Knee joint was aspirated by PAs. He has been taking colchicine bid since that time; knee is only mildly painful but no reaccumulation of fluid has occurred. Mild joint aches are present today due to working 12+ hours and high activity level today.   Lesion of L forearm remains pruritic and red. Topical OTC hydrocortisone cream of minimal benefit. He c/o itch around periphery of lesion.   Patient Active Problem List   Diagnosis Date Noted  . Gout     Prior to Admission medications   Medication Sig Start Date End Date Taking? Authorizing Provider  allopurinol (ZYLOPRIM) 100 MG tablet Take 100 mg by mouth daily.   Yes Historical Provider, MD  colchicine (COLCRYS) 0.6 MG tablet Take 1 tablet (0.6 mg total) by mouth 2 (two) times daily. 01/23/14  Yes Barton Fanny, MD  indomethacin (INDOCIN) 50 MG capsule Take 1 capsule (50 mg total) by mouth 3 (three) times daily with meals. 01/09/14   Jake Seats Brewington, PA-C    History   Social History  . Marital Status: Married    Spouse Name: Martin Majestic    Number of Children: 3  . Years of Education: 11   Occupational History  . Truck Geophysicist/field seismologist    Social History Main Topics  . Smoking status: Former Research scientist (life sciences)  . Smokeless tobacco: Current User    Types: Chew  . Alcohol Use: No  . Drug Use: No  . Sexual Activity: Yes    Partners: Female    Birth Control/ Protection: Surgical   Other Topics Concern  . Not on file   Social History Narrative   Lives with wife and his three children.    ROS: As per HPI.  O: Filed Vitals:   01/23/14 1356  BP: 102/68  Pulse: 119  Temp: 98.7 F (37.1 C)  Resp: 16   GEN: In NAD; WN,WD. HENT: [place]/AT; EOMI w/ clear conj/sclerae. Otherwise unremarkable. SKIN: W&D; intact. L forearm- 1.5 cm erythematous macule w/ shiny appearance. Facial area- erythema w/ scattered papulopustular rash on cheeks and  across nose. MS: MAEs; R knee slightly swollen w/ good ROM; no erythema or warmth of joint. NEURO: A&O x 3; CNs intact. Gait- slightly antalgic. Nonfocal.   Results for orders placed in visit on 01/09/14  GC/CHLAMYDIA PROBE AMP      Result Value Ref Range   CT Probe RNA NEGATIVE     GC Probe RNA NEGATIVE    BODY FLUID CULTURE      Result Value Ref Range   Gram Stain Rare     Gram Stain WBC present-predominately PMN     Gram Stain No Organisms Seen     Organism ID, Bacteria NO GROWTH 3 DAYS    URIC ACID      Result Value Ref Range   Uric Acid, Serum 8.6 (*) 4.0 - 7.8 mg/dL  SYNOVIAL FLUID PANEL      Result Value Ref Range   Protein, Synovial Fluid 4.6 (*) 1.0 - 3.0 g/dL   Color, Synovial ORANGE  YELLOW   Appearance-Synovial CLOUDY (*) CLEAR   WBC, Synovial 5765 (*) 0 - 200 cu mm   Neutrophil, Synovial 92 (*) 0 - 25 %   Lymphocytes-Synovial Fld 5  0 - 20 %   Monocyte/Macrophage 3 (*) 50 - 90 %   Eosinophils-Synovial 0  0 - 1 %   Crystals,  Fluid No crystals seen     Glucose, Synovial Fluid 77    POCT CBC      Result Value Ref Range   WBC 12.3 (*) 4.6 - 10.2 K/uL   Lymph, poc 3.4  0.6 - 3.4   POC LYMPH PERCENT 27.8  10 - 50 %L   MID (cbc) 0.5  0 - 0.9   POC MID % 4.3  0 - 12 %M   POC Granulocyte 8.4 (*) 2 - 6.9   Granulocyte percent 67.9  37 - 80 %G   RBC 5.56  4.69 - 6.13 M/uL   Hemoglobin 16.8  14.1 - 18.1 g/dL   HCT, POC 49.7  43.5 - 53.7 %   MCV 89.4  80 - 97 fL   MCH, POC 30.2  27 - 31.2 pg   MCHC 33.8  31.8 - 35.4 g/dL   RDW, POC 12.7     Platelet Count, POC 292  142 - 424 K/uL   MPV 7.0  0 - 99.8 fL    A/P: 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  Knee pain, right - Plan: colchicine (COLCRYS) 0.6 MG tablet  Skin lesion of left arm- Suspect Lichenoid skin eruption; doubt drug eruption because of solitary localization of lesion. Trial low-medium potency topical steroid.  Meds ordered this encounter   Medications  . colchicine (COLCRYS) 0.6 MG tablet    Sig: Take 1 tablet (0.6 mg total) by mouth 2 (two) times daily.    Dispense:  60 tablet    Refill:  2  . Clobetasol Prop Emollient Base 0.05 % emollient cream    Sig: Apply 1 application topically 2 (two) times daily.    Dispense:  30 g    Refill:  2

## 2014-04-24 ENCOUNTER — Ambulatory Visit: Payer: BC Managed Care – PPO | Admitting: Family Medicine

## 2014-04-29 ENCOUNTER — Ambulatory Visit: Payer: Self-pay | Admitting: Family Medicine

## 2014-07-27 ENCOUNTER — Telehealth: Payer: Self-pay

## 2014-07-27 DIAGNOSIS — M25561 Pain in right knee: Secondary | ICD-10-CM

## 2014-07-27 MED ORDER — COLCRYS 0.6 MG PO TABS: 0.6000 mg | ORAL_TABLET | Freq: Two times a day (BID) | ORAL | Status: DC

## 2014-07-27 NOTE — Telephone Encounter (Signed)
Pt called wanting to change his generic version of colchicine (COLCRYS) 0.6 MG tablet [[id number]] to the name brand-his insurance will pay better. He also is now taking 2 pills a day, so his script is lasting half a month. He would like it to last the whole month. Please advise at [phone]

## 2014-07-27 NOTE — Telephone Encounter (Signed)
Refilled twice daily for 3 months. Sent in name brand.

## 2014-07-27 NOTE — Telephone Encounter (Signed)
Please change to name brand.

## 2014-07-27 NOTE — Telephone Encounter (Signed)
lmom to notify pt.

## 2014-10-12 IMAGING — CR DG KNEE COMPLETE 4+V*L*
4 series · 4 of 4 positions shown · non-contrast
Comparison: None.

CLINICAL DATA: Left knee pain.  Gout.

EXAM:
LEFT KNEE - COMPLETE 4+ VIEW

[AP (1 of 3)]
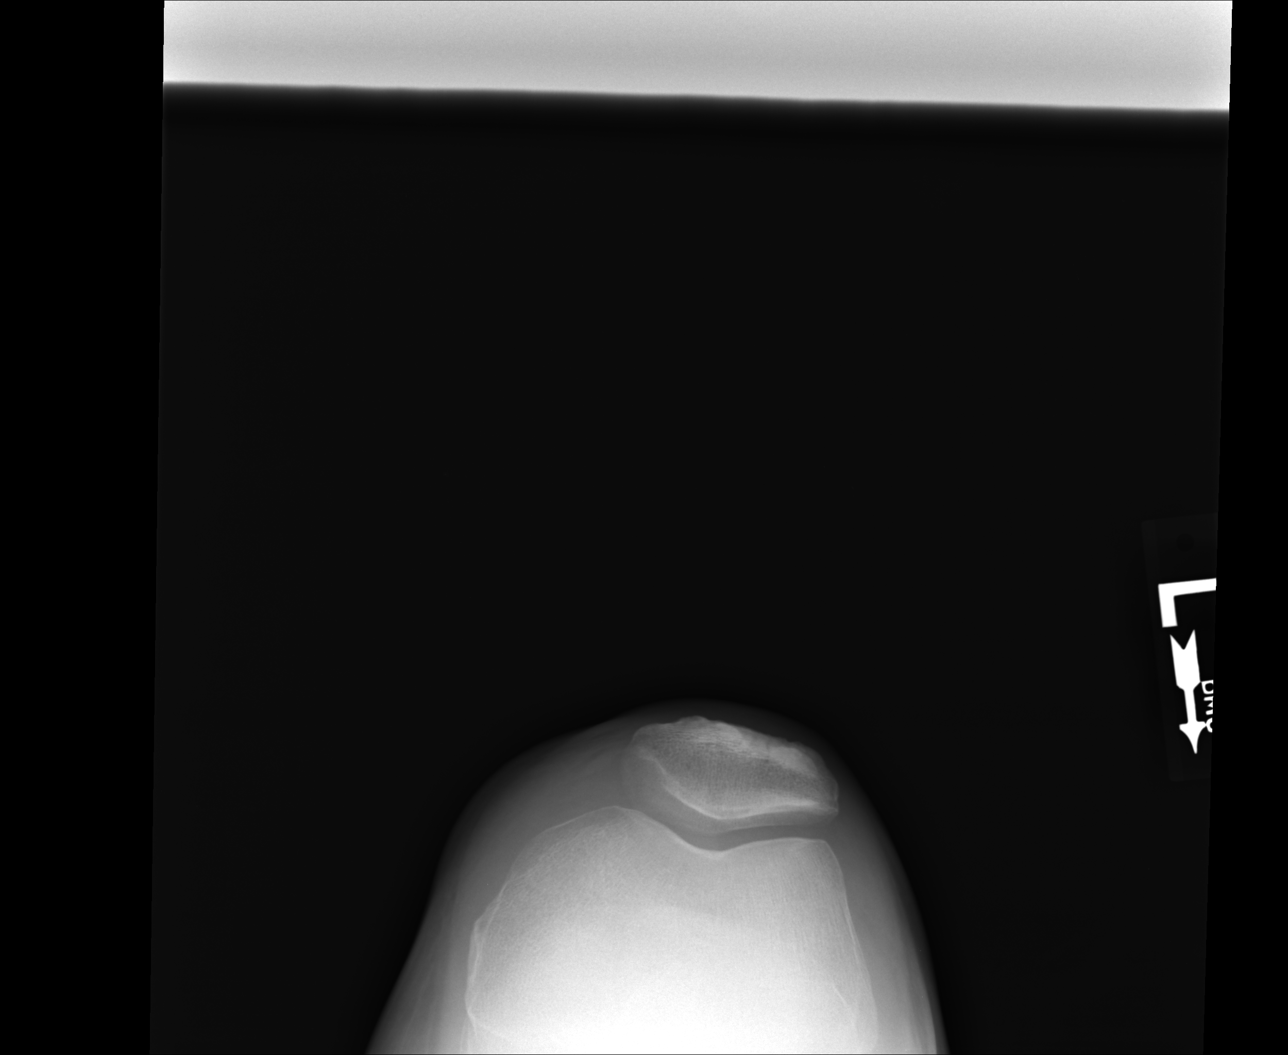

[other]
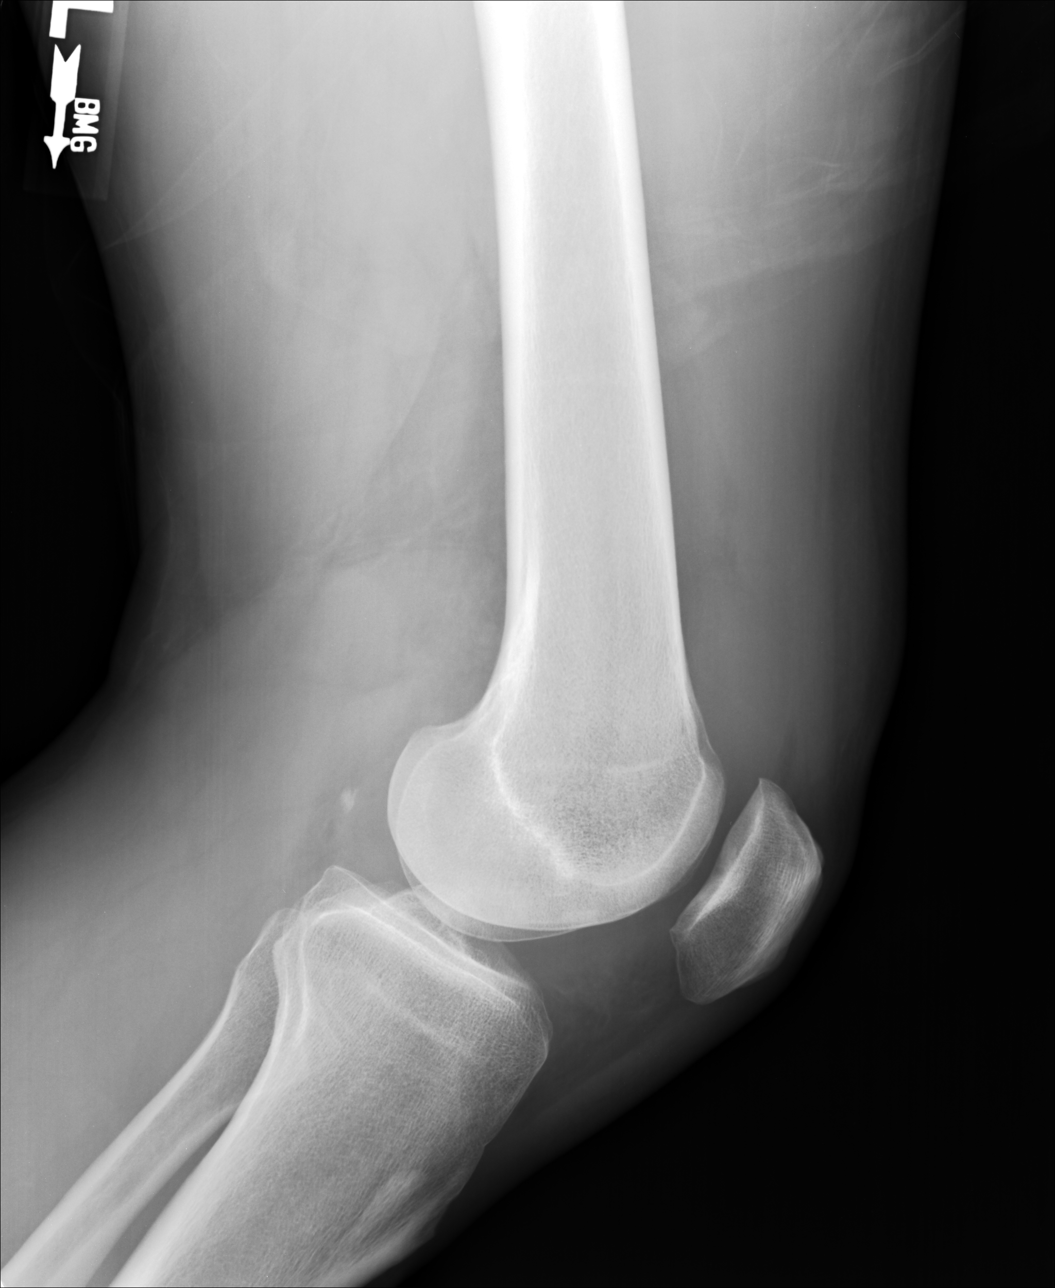

[AP (2 of 3)]
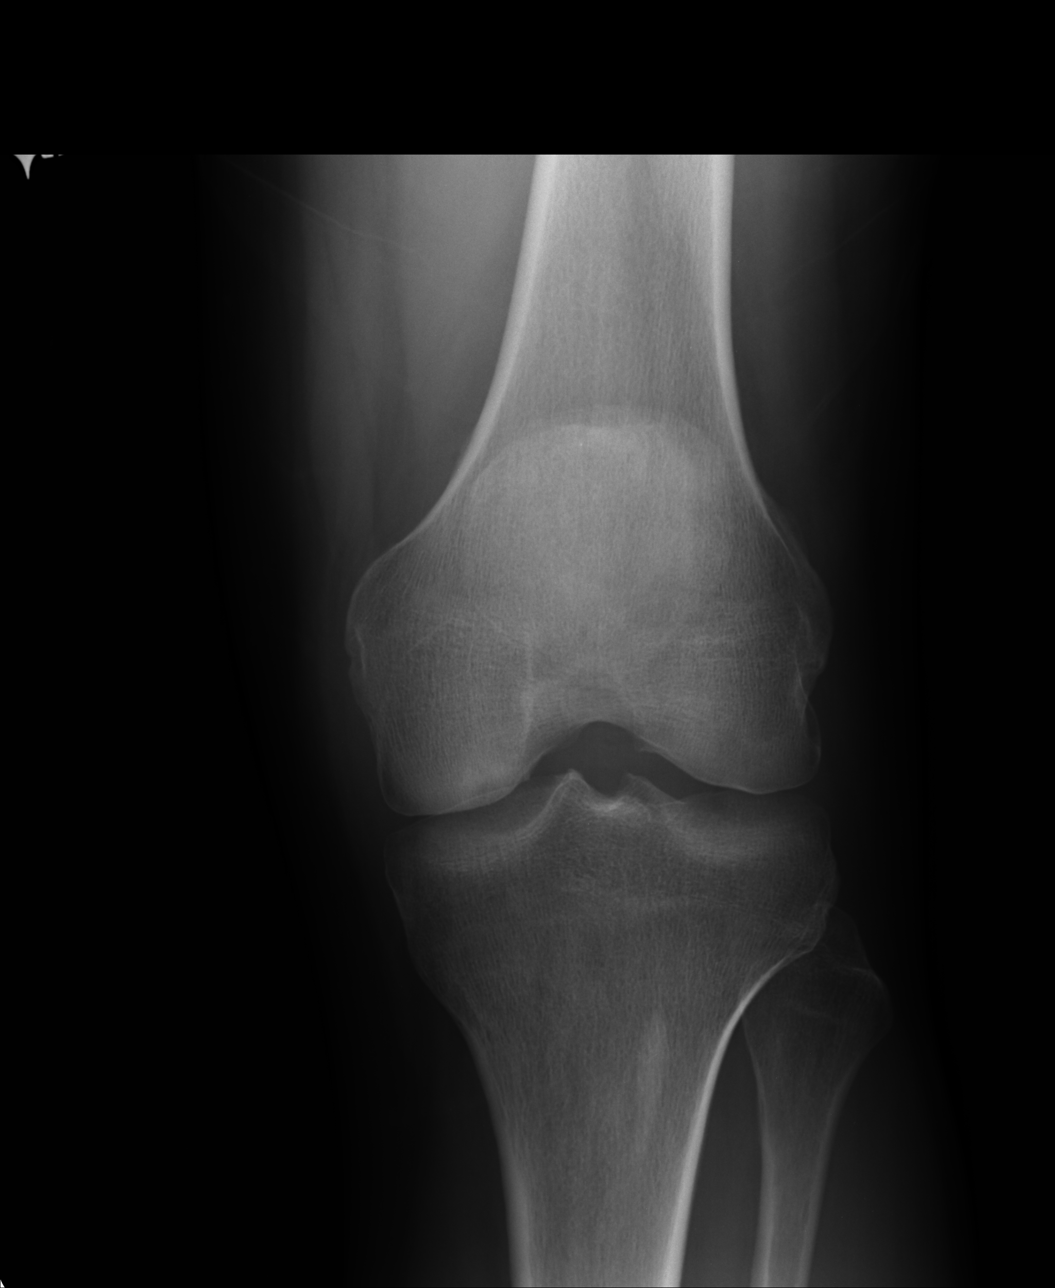

[AP (3 of 3)]
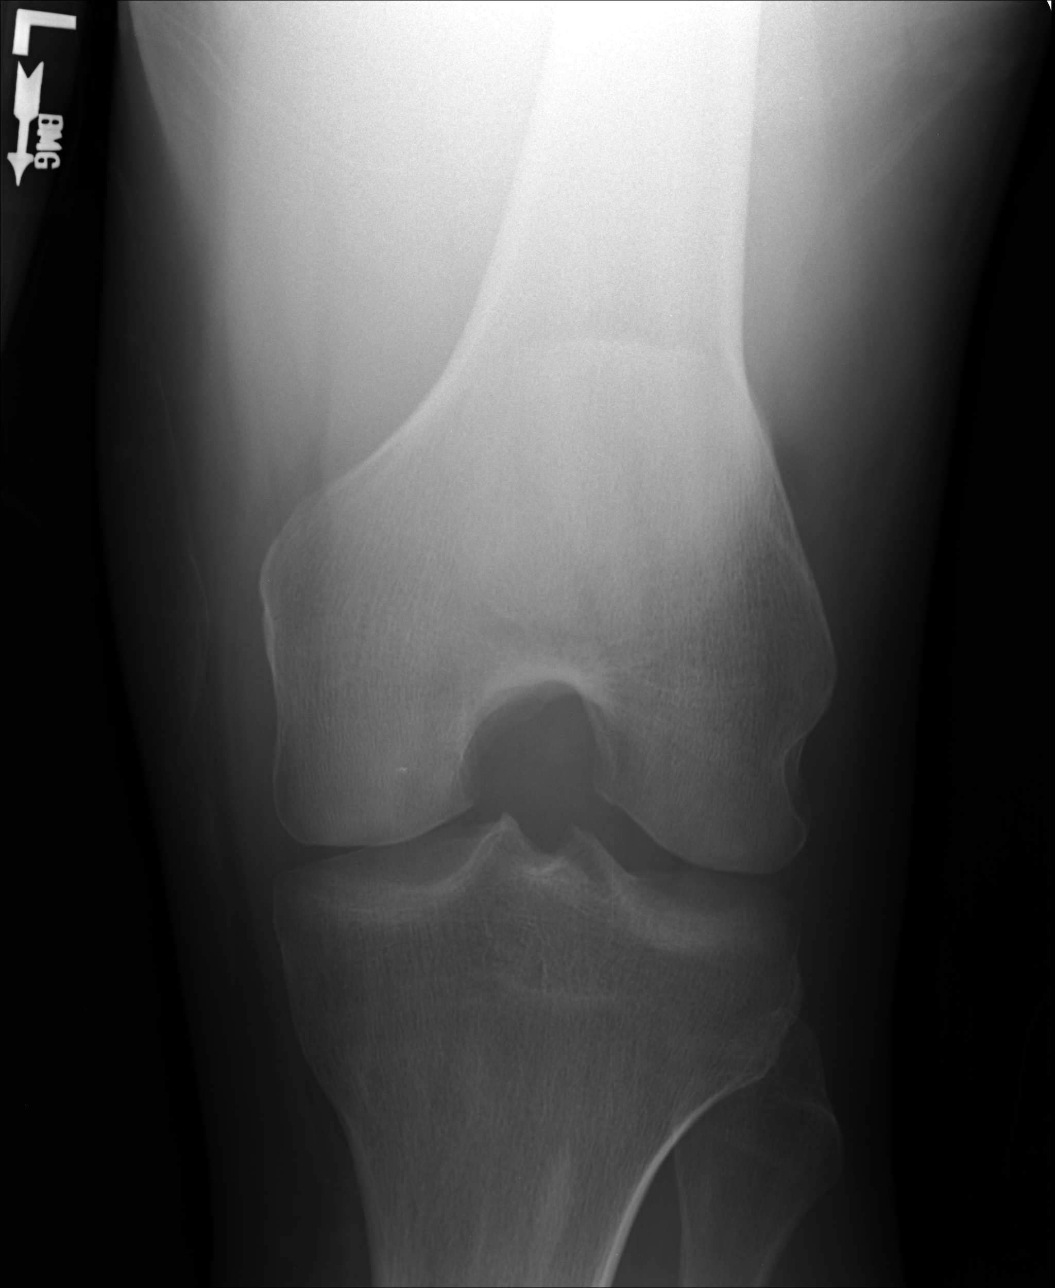

[4 of 4 positions shown; findings below may reference images not displayed]

FINDINGS: No acute bony abnormality. Specifically, no fracture, subluxation,
or dislocation. Soft tissues are intact. Minimal spurring along the
tibial spines and distal femur. There is a moderate joint effusion.
No acute bony abnormality. Specifically, no fracture, subluxation,
or dislocation. Soft tissues are intact.
IMPRESSION: Moderate joint effusion.  Early spurring.  No acute findings.

## 2015-01-21 ENCOUNTER — Other Ambulatory Visit: Payer: Self-pay | Admitting: Physician Assistant

## 2015-03-19 ENCOUNTER — Ambulatory Visit (INDEPENDENT_AMBULATORY_CARE_PROVIDER_SITE_OTHER): Payer: Managed Care, Other (non HMO) | Admitting: Physician Assistant

## 2015-03-19 ENCOUNTER — Encounter: Payer: Self-pay | Admitting: Physician Assistant

## 2015-03-19 VITALS — BP 137/80 | HR 100 | Temp 98.5°F | Resp 16 | Ht 70.5 in | Wt 197.0 lb

## 2015-03-19 DIAGNOSIS — M25561 Pain in right knee: Secondary | ICD-10-CM

## 2015-03-19 DIAGNOSIS — Z23 Encounter for immunization: Secondary | ICD-10-CM

## 2015-03-19 MED ORDER — COLCRYS 0.6 MG PO TABS: ORAL_TABLET | ORAL | Status: DC

## 2015-03-19 NOTE — Progress Notes (Signed)
   Mitchell Reed  MRN: [id number] DOB: 1970-06-26  Subjective:  Pt presents to clinic with left knee pain.  He has been on Colcrys daily for about a year and as long as he is on it he has no problems with pain in his knee but he has run out of it twice and both times he has had a flair of his gout like knee pain.  About a week ago he started with pain and went to [facility] who gave him a 5 day supply so currently his pain is better - he would like to try and make it so he does not run out.  He is also unsure when his last tetanus was so he would like to get that today.      Patient Active Problem List   Diagnosis Date Noted  . Gout     No current outpatient prescriptions on file prior to visit.   No current facility-administered medications on file prior to visit.    No Known Allergies  Review of Systems  Musculoskeletal: Positive for joint swelling (left knee only) and gait problem (2nd to pain).   Objective:  BP 137/80 mmHg  Pulse 100  Temp(Src) 98.5 F (36.9 C) (Oral)  Resp 16  Ht 5' 10.5" (1.791 m)  Wt 197 lb (89.359 kg)  BMI 27.86 kg/m2  SpO2 98%  Physical Exam  Constitutional: He is oriented to person, place, and time and well-developed, well-nourished, and in no distress.  HENT:  Head: Normocephalic and atraumatic.  Right Ear: External ear normal.  Left Ear: External ear normal.  Eyes: Conjunctivae are normal.  Neck: Normal range of motion.  Pulmonary/Chest: Effort normal.  Musculoskeletal:       Left knee: He exhibits decreased range of motion, swelling and effusion. He exhibits no erythema.  Neurological: He is alert and oriented to person, place, and time. Gait normal.  Skin: Skin is warm and dry.  Psychiatric: Mood, memory, affect and judgment normal.    Assessment and Plan :  Knee pain, right - Plan: COLCRYS 0.6 MG tablet  Need for Tdap vaccination - Plan: Tdap vaccine greater than or equal to 7yo IM   Gave him refills of medication that is  controlling his pain.  He will consider scheduling a CPE as he has not had one in years.  Windell Hummingbird PA-C  [facility] 03/19/2015 4:09 PM

## 2015-03-26 ENCOUNTER — Encounter: Payer: Self-pay | Admitting: Physician Assistant

## 2015-03-26 ENCOUNTER — Ambulatory Visit (INDEPENDENT_AMBULATORY_CARE_PROVIDER_SITE_OTHER): Payer: Managed Care, Other (non HMO) | Admitting: Physician Assistant

## 2015-03-26 VITALS — BP 136/88 | HR 85 | Temp 97.8°F | Resp 16 | Ht 70.75 in | Wt 194.0 lb

## 2015-03-26 DIAGNOSIS — Z0000 Encounter for general adult medical examination without abnormal findings: Secondary | ICD-10-CM | POA: Diagnosis not present

## 2015-03-26 DIAGNOSIS — Z130 Encounter for screening for diseases of the blood and blood-forming organs and certain disorders involving the immune mechanism: Secondary | ICD-10-CM

## 2015-03-26 DIAGNOSIS — Z13220 Encounter for screening for lipoid disorders: Secondary | ICD-10-CM

## 2015-03-26 DIAGNOSIS — Z1329 Encounter for screening for other suspected endocrine disorder: Secondary | ICD-10-CM | POA: Diagnosis not present

## 2015-03-26 DIAGNOSIS — Z13228 Encounter for screening for other metabolic disorders: Secondary | ICD-10-CM

## 2015-03-26 LAB — CBC WITH DIFFERENTIAL/PLATELET
BASOS ABS: 0.1 10*3/uL (ref 0.0–0.1)
Basophils Relative: 1 % (ref 0–1)
Eosinophils Absolute: 0.3 10*3/uL (ref 0.0–0.7)
Eosinophils Relative: 3 % (ref 0–5)
HEMATOCRIT: 47.9 % (ref 39.0–52.0)
HEMOGLOBIN: 17.1 g/dL — AB (ref 13.0–17.0)
LYMPHS PCT: 34 % (ref 12–46)
Lymphs Abs: 3.1 10*3/uL (ref 0.7–4.0)
MCH: 31.2 pg (ref 26.0–34.0)
MCHC: 35.7 g/dL (ref 30.0–36.0)
MCV: 87.4 fL (ref 78.0–100.0)
MONO ABS: 0.6 10*3/uL (ref 0.1–1.0)
MPV: 9 fL (ref 8.6–12.4)
Monocytes Relative: 7 % (ref 3–12)
NEUTROS PCT: 55 % (ref 43–77)
Neutro Abs: 5.1 10*3/uL (ref 1.7–7.7)
Platelets: 281 10*3/uL (ref 150–400)
RBC: 5.48 MIL/uL (ref 4.22–5.81)
RDW: 13.4 % (ref 11.5–15.5)
WBC: 9.2 10*3/uL (ref 4.0–10.5)

## 2015-03-26 LAB — COMPLETE METABOLIC PANEL WITH GFR
ALBUMIN: 4.4 g/dL (ref 3.6–5.1)
ALK PHOS: 62 U/L (ref 40–115)
ALT: 57 U/L — AB (ref 9–46)
AST: 39 U/L (ref 10–40)
BUN: 7 mg/dL (ref 7–25)
CALCIUM: 9.7 mg/dL (ref 8.6–10.3)
CO2: 24 mmol/L (ref 20–31)
CREATININE: 1.11 mg/dL (ref 0.60–1.35)
Chloride: 104 mmol/L (ref 98–110)
GFR, Est African American: >89 mL/min (ref >=60)
GFR, Est Non African American: 81 mL/min (ref >=60)
Glucose, Bld: 91 mg/dL (ref 65–99)
Potassium: 3.9 mmol/L (ref 3.5–5.3)
Sodium: 139 mmol/L (ref 135–146)
TOTAL PROTEIN: 7.4 g/dL (ref 6.1–8.1)
Total Bilirubin: 0.6 mg/dL (ref 0.2–1.2)

## 2015-03-26 LAB — LIPID PANEL
CHOLESTEROL: 231 mg/dL — AB (ref 125–200)
HDL: 28 mg/dL — ABNORMAL LOW (ref >=40)
LDL Cholesterol: 132 mg/dL — ABNORMAL HIGH (ref <130)
Total CHOL/HDL Ratio: 8.3 Ratio — ABNORMAL HIGH (ref <=5.0)
Triglycerides: 354 mg/dL — ABNORMAL HIGH (ref <150)
VLDL: 71 mg/dL — AB (ref <30)

## 2015-03-26 LAB — TSH: TSH: 1.761 u[IU]/mL (ref 0.350–4.500)

## 2015-03-26 NOTE — Patient Instructions (Signed)

## 2015-03-26 NOTE — Addendum Note (Signed)
Addended by: Constance Goltz on: 03/26/2015 04:08 PM   Modules accepted: Miquel Dunn

## 2015-03-26 NOTE — Progress Notes (Signed)
Mitchell Reed  MRN: [id number] DOB: [birth year]  Subjective:  Pt presents to clinic for a CPE. He is having no problems.  Last dental exam: every 6 months Last vision exam: never  Vaccinations      Tetanus - last week    Patient Active Problem List   Diagnosis Date Noted  . Gout     Current Outpatient Prescriptions on File Prior to Visit  Medication Sig Dispense Refill  . COLCRYS 0.6 MG tablet TAKE 1 TABLET (0.6 MG TOTAL) BY MOUTH 2 (TWO) TIMES DAILY   "OFFICE VISIT NEEDED FOR REFILLS 180 tablet 3   No current facility-administered medications on file prior to visit.    No Known Allergies  Social History   Social History  . Marital Status: Married    Spouse Name: Martin Majestic  . Number of Children: 3  . Years of Education: 11   Occupational History  . Truck Geophysicist/field seismologist    Social History Main Topics  . Smoking status: Former Smoker -- 1.50 packs/day for 7 years    Quit date: 04/22/2014  . Smokeless tobacco: Current User    Types: Chew  . Alcohol Use: No  . Drug Use: No  . Sexual Activity:    Partners: Female    Patent examiner Protection: Surgical   Other Topics Concern  . None   Social History Narrative   Divorced -    3 daughters - sole custody   Education - high school   Work - Administrator    Past Surgical History  Procedure Laterality Date  . Vasectomy      Family History  Problem Relation Age of Onset  . Hypertension Mother   . COPD Mother   . Sarcoidosis Mother   . Pulmonary fibrosis Mother   . Hyperlipidemia Mother   . Diabetes Mother   . Heart disease Father   . Hyperlipidemia Father   . Hypertension Father   . Cancer Father   . Diabetes Brother     Review of Systems  Constitutional: Negative.   HENT: Negative.   Eyes: Negative.   Respiratory: Negative.   Cardiovascular: Negative.   Gastrointestinal: Negative.   Endocrine: Negative.   Genitourinary: Negative.   Musculoskeletal: Negative.   Skin: Negative.   Allergic/Immunologic:  Negative.   Neurological: Negative.   Hematological: Negative.   Psychiatric/Behavioral: Negative.    Objective:  BP 136/88 mmHg  Pulse 85  Temp(Src) 97.8 F (36.6 C) (Oral)  Resp 16  Ht 5' 10.75" (1.797 m)  Wt 194 lb (87.998 kg)  BMI 27.25 kg/m2  SpO2 98%  Physical Exam  Constitutional: He is oriented to person, place, and time and well-developed, well-nourished, and in no distress.  HENT:  Head: Normocephalic and atraumatic.  Right Ear: Hearing, tympanic membrane, external ear and ear canal normal.  Left Ear: Hearing, tympanic membrane, external ear and ear canal normal.  Nose: Nose normal.  Mouth/Throat: Uvula is midline, oropharynx is clear and moist and mucous membranes are normal.  Eyes: Conjunctivae and EOM are normal. Pupils are equal, round, and reactive to light.  Neck: Trachea normal and normal range of motion. Neck supple. No thyroid mass and no thyromegaly present.  Cardiovascular: Normal rate, regular rhythm and normal heart sounds.   No murmur heard. Pulmonary/Chest: Effort normal and breath sounds normal.  Abdominal: Soft. Bowel sounds are normal.  Genitourinary: Testes/scrotum normal and penis normal.  Musculoskeletal: Normal range of motion.  Neurological: He is alert and oriented to person, place, and  time. Gait normal.  Skin: Skin is warm and dry.  Psychiatric: Mood, memory, affect and judgment normal.   No exam data present  Assessment and Plan :  Annual physical exam-  Anticipatory guidance  Screening for deficiency anemia - Plan: CBC with Differential/Platelet  Screening for metabolic disorder - Plan: COMPLETE METABOLIC PANEL WITH GFR  Screening cholesterol level - Plan: Lipid panel  Screening for thyroid disorder - Plan: TSH  Windell Hummingbird PA-C  [facility] 03/26/2015 4:04 PM

## 2015-03-31 ENCOUNTER — Other Ambulatory Visit: Payer: Self-pay | Admitting: Physician Assistant

## 2015-03-31 DIAGNOSIS — R945 Abnormal results of liver function studies: Principal | ICD-10-CM

## 2015-03-31 DIAGNOSIS — R7989 Other specified abnormal findings of blood chemistry: Secondary | ICD-10-CM

## 2015-05-13 ENCOUNTER — Other Ambulatory Visit (INDEPENDENT_AMBULATORY_CARE_PROVIDER_SITE_OTHER): Payer: Managed Care, Other (non HMO)

## 2015-05-13 ENCOUNTER — Other Ambulatory Visit: Payer: Self-pay | Admitting: Physician Assistant

## 2015-05-13 DIAGNOSIS — R945 Abnormal results of liver function studies: Principal | ICD-10-CM

## 2015-05-13 DIAGNOSIS — R7989 Other specified abnormal findings of blood chemistry: Secondary | ICD-10-CM

## 2015-05-13 LAB — COMPLETE METABOLIC PANEL WITH GFR
ALBUMIN: 4.1 g/dL (ref 3.6–5.1)
ALK PHOS: 55 U/L (ref 40–115)
ALT: 80 U/L — ABNORMAL HIGH (ref 9–46)
AST: 47 U/L — AB (ref 10–40)
BUN: 8 mg/dL (ref 7–25)
CALCIUM: 9.1 mg/dL (ref 8.6–10.3)
CHLORIDE: 104 mmol/L (ref 98–110)
CO2: 22 mmol/L (ref 20–31)
Creat: 1.2 mg/dL (ref 0.60–1.35)
GFR, Est African American: 85 mL/min (ref >=60)
GFR, Est Non African American: 74 mL/min (ref >=60)
GLUCOSE: 94 mg/dL (ref 65–99)
Potassium: 3.8 mmol/L (ref 3.5–5.3)
Sodium: 139 mmol/L (ref 135–146)
Total Bilirubin: 0.8 mg/dL (ref 0.2–1.2)
Total Protein: 6.9 g/dL (ref 6.1–8.1)

## 2015-05-16 NOTE — Addendum Note (Signed)
Addended by: Frutoso Chase A on: 05/16/2015 01:01 PM   Modules accepted: Miquel Dunn

## 2015-05-17 LAB — HEPATITIS C ANTIBODY: HCV Ab: NEGATIVE

## 2015-05-17 LAB — HEPATITIS B SURFACE ANTIGEN: HEP B S AG: NEGATIVE

## 2015-05-20 ENCOUNTER — Ambulatory Visit (INDEPENDENT_AMBULATORY_CARE_PROVIDER_SITE_OTHER): Payer: Managed Care, Other (non HMO) | Admitting: Urgent Care

## 2015-05-20 ENCOUNTER — Ambulatory Visit (INDEPENDENT_AMBULATORY_CARE_PROVIDER_SITE_OTHER): Payer: Managed Care, Other (non HMO)

## 2015-05-20 ENCOUNTER — Encounter: Payer: Self-pay | Admitting: Urgent Care

## 2015-05-20 VITALS — BP 128/80 | HR 105 | Temp 97.9°F | Resp 16 | Ht 70.75 in | Wt 196.0 lb

## 2015-05-20 DIAGNOSIS — R1084 Generalized abdominal pain: Secondary | ICD-10-CM

## 2015-05-20 DIAGNOSIS — R197 Diarrhea, unspecified: Secondary | ICD-10-CM | POA: Diagnosis not present

## 2015-05-20 DIAGNOSIS — R7989 Other specified abnormal findings of blood chemistry: Secondary | ICD-10-CM | POA: Diagnosis not present

## 2015-05-20 DIAGNOSIS — R945 Abnormal results of liver function studies: Secondary | ICD-10-CM

## 2015-05-20 LAB — CBC
HEMATOCRIT: 49.9 % (ref 39.0–52.0)
Hemoglobin: 17.6 g/dL — ABNORMAL HIGH (ref 13.0–17.0)
MCH: 31.2 pg (ref 26.0–34.0)
MCHC: 35.3 g/dL (ref 30.0–36.0)
MCV: 88.3 fL (ref 78.0–100.0)
MPV: 9.7 fL (ref 8.6–12.4)
Platelets: 216 10*3/uL (ref 150–400)
RBC: 5.65 MIL/uL (ref 4.22–5.81)
RDW: 13.7 % (ref 11.5–15.5)
WBC: 8.5 10*3/uL (ref 4.0–10.5)

## 2015-05-20 LAB — POCT GLYCOSYLATED HEMOGLOBIN (HGB A1C): Hemoglobin A1C: 5.2

## 2015-05-20 NOTE — Progress Notes (Signed)
    MRN: [id number] DOB: Mar 07, 1971  Subjective:   Mitchell Reed is a 45 y.o. male presenting for chief complaint of Abdominal Pain; Bloated; and Diarrhea  Reports ~4 month history loose stools/watery diarrhea, is having ~10 BM. 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 Patient eats a variety of foods but no food in particular seems to cause the problem. Also drinks plenty of water, ~2 Mountain Dews (40 ounces/day) daily. He also feels really bloated and causes mid-belly pain. Denies fever, n/v, bloody stools, chest pain, cough. Denies alcohol use. Has 1 brother with diabetes. Of note, patient has had elevated LFTs since 01/2015, tested negative for hepatitis. Has been followed up by [facility].  Ladavion has a current medication list which includes the following prescription(s): colcrys. Also has No Known Allergies.  Sheldon  has a past medical history of Gout. Also  has past surgical history that includes Vasectomy.  Objective:   Vitals: BP 128/80 mmHg  Pulse 105  Temp(Src) 97.9 F (36.6 C) (Oral)  Resp 16  Ht 5' 10.75" (1.797 m)  Wt 196 lb (88.905 kg)  BMI 27.53 kg/m2  SpO2 96%  Physical Exam  Constitutional: He is oriented to person, place, and time. He appears well-developed and well-nourished.  HENT:  Mouth/Throat: Oropharynx is clear and moist.  Cardiovascular: Normal rate, regular rhythm and intact distal pulses.  Exam reveals no gallop and no friction rub.   No murmur heard. Pulmonary/Chest: No respiratory distress. He has no wheezes. He has no rales.  Abdominal: Soft. Bowel sounds are normal. He exhibits no distension and no mass. There is tenderness (generalized throughout).  Neurological: He is alert and oriented to person, place, and time.  Skin: Skin is warm and dry.   Results for orders placed or performed in visit on 05/20/15 (from the past 24 hour(s))  POCT glycosylated hemoglobin (Hb A1C)     Status: None   Collection Time: 05/20/15 12:46 PM  Result Value Ref Range   Hemoglobin A1C 5.2    Dg Abd 1 View  05/20/2015  CLINICAL DATA:  Abdominal pain and diarrhea EXAM: ABDOMEN - 1 VIEW COMPARISON:  None. FINDINGS: There is a fairly mild degree of stool throughout the colon. There is no bowel dilatation or air-fluid level suggesting obstruction. No free air. There is lumbar levoscoliosis. IMPRESSION: Bowel gas pattern unremarkable. No demonstrable obstruction or free air on this supine examination. Electronically Signed   By: Lowella Grip III M.D.   On: 05/20/2015 12:17   Assessment and Plan :   1. Diarrhea, unspecified type 2. Generalized abdominal pain - Unclear source, however, I suspect that this may be osmotic diarrhea secondary to caffeine and sugar from [facility]. Stool culture is pending, consider CT or referral to GI.  3. Elevated LFTs - Will forward results to PA-Sarah, recommended follow up with her.   Jaynee Eagles, PA-C [facility] [phone] 05/20/2015 11:56 AM

## 2015-05-20 NOTE — Patient Instructions (Addendum)
Because you received an x-ray today, you will receive an invoice from [facility]. Please contact [facility] at [phone] with questions or concerns regarding your invoice. Our billing staff will not be able to assist you with those questions.  -Please stop drinking [facility] and cut down on your caffeine intake. Hydrate well and continue using Imodium as you need to for diarrhea.  Chronic Diarrhea Diarrhea is frequent loose and watery bowel movements. It can cause you to feel weak and dehydrated. Dehydration can cause you to become tired and thirsty and to have a dry mouth, decreased urination, and dark yellow urine. Diarrhea is a sign of another problem, most often an infection that will not last long. In most cases, diarrhea lasts 2-3 days. Diarrhea that lasts longer than 4 weeks is called long-lasting (chronic) diarrhea. It is important to treat your diarrhea as directed by your health care provider to lessen or prevent future episodes of diarrhea.  CAUSES  There are many causes of chronic diarrhea. The following are some possible causes:   Gastrointestinal infections caused by viruses, bacteria, or parasites.   Food poisoning or food allergies.   Certain medicines, such as antibiotics, chemotherapy, and laxatives.   Artificial sweeteners and fructose.   Digestive disorders, such as celiac disease and inflammatory bowel diseases.   Irritable bowel syndrome.  Some disorders of the pancreas.  Disorders of the thyroid.  Reduced blood flow to the intestines.  Cancer. Sometimes the cause of chronic diarrhea is unknown. RISK FACTORS  Having a severely weakened immune system, such as from HIV or AIDS.   Taking certain types of cancer-fighting drugs (such as with chemotherapy) or other medicines.   Having had a recent organ transplant.   Having a portion of the stomach or small bowel removed.   Traveling to countries where food and water supplies  are often contaminated.  SYMPTOMS  In addition to frequent, loose stools, diarrhea may cause:   Cramping.   Abdominal pain.   Nausea.   Fever.  Fatigue.  Urgent need to use the bathroom.  Loss of bowel control. DIAGNOSIS  Your health care provider must take a careful history and perform a physical exam. Tests given are based on your symptoms and history. Tests may include:   Blood or stool tests. Three or more stool samples may be examined. Stool cultures may be used to test for bacteria or parasites.   X-rays.   A procedure in which a thin tube is inserted into the mouth or rectum (endoscopy). This allows the health care provider to look inside the intestine.  TREATMENT   Treatment is aimed at correcting the cause of the diarrhea when possible.  Diarrhea caused by an infection can often be treated with antibiotic medicines.  Diarrhea not caused by an infection may require you to take long-term medicine or have surgery. Specific treatment should be discussed with your health care provider.  If the cause cannot be determined, treatment aims to relieve symptoms and prevent dehydration. Serious health problems can occur if you do not maintain proper fluid levels. Treatment may include:  Taking an oral rehydration solution (ORS).  Not drinking beverages that contain caffeine (such as tea, coffee, and soft drinks).  Not drinking alcohol.  Maintaining well-balanced nutrition to help you recover faster. HOME CARE INSTRUCTIONS   Drink enough fluids to keep urine clear or pale yellow. Drink 1 cup (8 oz) of fluid for each diarrhea episode. Avoid fluids that contain simple sugars, fruit juices, whole  milk products, and sodas. Hydrate with an ORS. You may purchase the ORS or prepare it at home by mixing the following ingredients together:   - tsp (1.7-3  mL) table salt.   tsp (3  mL) baking soda.   tsp (1.7 mL) salt substitute containing potassium chloride.  1 tbsp  (20 mL) sugar.  4.2 c (1 L) of water.   Certain foods and beverages may increase the speed at which food moves through the gastrointestinal (GI) tract. These foods and beverages should be avoided. They include:  Caffeinated and alcoholic beverages.  High-fiber foods, such as raw fruits and vegetables, nuts, seeds, and whole grain breads and cereals.  Foods and beverages sweetened with sugar alcohols, such as xylitol, sorbitol, and mannitol.   Some foods may be well tolerated and may help thicken stool. These include:  Starchy foods, such as rice, toast, pasta, low-sugar cereal, oatmeal, grits, baked potatoes, crackers, and bagels.  Bananas.  Applesauce.  Add probiotic-rich foods to help increase healthy bacteria in the GI tract. These include yogurt and fermented milk products.  Wash your hands well after each diarrhea episode.  Only take over-the-counter or prescription medicines as directed by your health care provider.  Take a warm bath to relieve any burning or pain from frequent diarrhea episodes. SEEK MEDICAL CARE IF:   You are not urinating as often.  Your urine is a dark color.  You become very tired or dizzy.  You have severe pain in the abdomen or rectum.  Your have blood or pus in your stools.  Your stools look black and tarry. SEEK IMMEDIATE MEDICAL CARE IF:   You are unable to keep fluids down.  You have persistent vomiting.  You have blood in your stool.  Your stools are black and tarry.  You do not urinate in 6-8 hours, or there is only a small amount of very dark urine.  You have abdominal pain that increases or localizes.  You have weakness, dizziness, confusion, or lightheadedness.  You have a severe headache.  Your diarrhea gets worse or does not get better.  You have a fever or persistent symptoms for more than 2-3 days.  You have a fever and your symptoms suddenly get worse. MAKE SURE YOU:   Understand these instructions.  Will  watch your condition.  Will get help right away if you are not doing well or get worse.   This information is not intended to replace advice given to you by your health care provider. Make sure you discuss any questions you have with your health care provider.   Document Released: 06/10/2003 Document Revised: 03/25/2013 Document Reviewed: 09/12/2012 Elsevier Interactive Patient Education [employer].

## 2015-05-24 ENCOUNTER — Other Ambulatory Visit: Payer: Self-pay | Admitting: Urgent Care

## 2015-05-24 DIAGNOSIS — R195 Other fecal abnormalities: Secondary | ICD-10-CM

## 2015-05-24 DIAGNOSIS — R945 Abnormal results of liver function studies: Secondary | ICD-10-CM

## 2015-05-24 DIAGNOSIS — R7989 Other specified abnormal findings of blood chemistry: Secondary | ICD-10-CM

## 2015-05-25 ENCOUNTER — Other Ambulatory Visit (INDEPENDENT_AMBULATORY_CARE_PROVIDER_SITE_OTHER): Payer: Managed Care, Other (non HMO)

## 2015-05-25 DIAGNOSIS — R195 Other fecal abnormalities: Secondary | ICD-10-CM | POA: Diagnosis not present

## 2015-05-25 DIAGNOSIS — R7989 Other specified abnormal findings of blood chemistry: Secondary | ICD-10-CM

## 2015-05-25 DIAGNOSIS — R945 Abnormal results of liver function studies: Secondary | ICD-10-CM

## 2015-05-26 ENCOUNTER — Encounter (HOSPITAL_COMMUNITY): Payer: Self-pay

## 2015-05-26 ENCOUNTER — Ambulatory Visit (INDEPENDENT_AMBULATORY_CARE_PROVIDER_SITE_OTHER): Payer: Managed Care, Other (non HMO) | Admitting: Urgent Care

## 2015-05-26 ENCOUNTER — Telehealth: Payer: Self-pay | Admitting: Radiology

## 2015-05-26 ENCOUNTER — Other Ambulatory Visit: Payer: Self-pay | Admitting: Urgent Care

## 2015-05-26 ENCOUNTER — Telehealth: Payer: Self-pay

## 2015-05-26 ENCOUNTER — Ambulatory Visit (HOSPITAL_COMMUNITY)
Admission: RE | Admit: 2015-05-26 | Discharge: 2015-05-26 | Disposition: A | Discharge status: Home / Self Care | Payer: Managed Care, Other (non HMO) | Source: Other Acute Inpatient Hospital | Attending: Emergency Medicine | Admitting: Emergency Medicine

## 2015-05-26 ENCOUNTER — Ambulatory Visit (HOSPITAL_COMMUNITY)
Admission: RE | Admit: 2015-05-26 | Discharge: 2015-05-26 | Disposition: A | Discharge status: Home / Self Care | Payer: Managed Care, Other (non HMO) | Source: Ambulatory Visit | Attending: Urgent Care | Admitting: Urgent Care

## 2015-05-26 VITALS — BP 124/86 | HR 77 | Temp 98.7°F | Resp 16 | Ht 70.75 in | Wt 199.8 lb

## 2015-05-26 DIAGNOSIS — N281 Cyst of kidney, acquired: Secondary | ICD-10-CM | POA: Diagnosis not present

## 2015-05-26 DIAGNOSIS — R1032 Left lower quadrant pain: Secondary | ICD-10-CM | POA: Diagnosis not present

## 2015-05-26 DIAGNOSIS — R109 Unspecified abdominal pain: Secondary | ICD-10-CM

## 2015-05-26 DIAGNOSIS — R1084 Generalized abdominal pain: Secondary | ICD-10-CM

## 2015-05-26 DIAGNOSIS — R1031 Right lower quadrant pain: Secondary | ICD-10-CM | POA: Insufficient documentation

## 2015-05-26 DIAGNOSIS — R195 Other fecal abnormalities: Secondary | ICD-10-CM

## 2015-05-26 LAB — HEPATIC FUNCTION PANEL
ALBUMIN: 4.1 g/dL (ref 3.6–5.1)
ALK PHOS: 57 U/L (ref 40–115)
ALT: 70 U/L — ABNORMAL HIGH (ref 9–46)
AST: 39 U/L (ref 10–40)
BILIRUBIN INDIRECT: 0.6 mg/dL (ref 0.2–1.2)
Bilirubin, Direct: 0.1 mg/dL (ref <=0.2)
Total Bilirubin: 0.7 mg/dL (ref 0.2–1.2)
Total Protein: 6.8 g/dL (ref 6.1–8.1)

## 2015-05-26 LAB — POC MICROSCOPIC URINALYSIS (UMFC): Mucus: ABSENT

## 2015-05-26 LAB — POCT URINALYSIS DIP (MANUAL ENTRY)
BILIRUBIN UA: NEGATIVE
GLUCOSE UA: NEGATIVE
Ketones, POC UA: NEGATIVE
LEUKOCYTES UA: NEGATIVE
NITRITE UA: NEGATIVE
PH UA: 7
Protein Ur, POC: NEGATIVE
RBC UA: NEGATIVE
Spec Grav, UA: 1.01
UROBILINOGEN UA: 0.2

## 2015-05-26 MED ORDER — IOHEXOL 300 MG/ML  SOLN
100.0000 mL | Freq: Once | INTRAMUSCULAR | Status: AC | PRN
  Administered 2015-05-26: 100 mL via INTRAVENOUS

## 2015-05-26 NOTE — Progress Notes (Signed)
    MRN: [id number] DOB: May 10, 1970  Subjective:   Mitchell Reed is a 45 y.o. male presenting for chief complaint of Follow-up  Patient has been seen for the past 3 months for elevated LFTs. I saw the patient on 05/20/2015 for several month history of loose watery stools which he had not reported. His symptoms have since progressed to diffuse abdominal pain. He did cut back significantly on his [place] drinking, tried to make dietary modifications. Denies alcohol use. Quit smoking 15 years ago. Denies fevers, nausea, vomiting, bloody stools, cough, chest pain, dysuria, hematuria. His most labs have been normal except for down-trending LFTs.  Mitchell Reed has a current medication list which includes the following prescription(s): colcrys. Also has No Known Allergies.  Mitchell Reed  has a past medical history of Gout. Also  has past surgical history that includes Vasectomy.   His family history includes COPD in his mother; Cancer in his father; Diabetes in his brother and mother; Heart disease in his father; Hyperlipidemia in his father and mother; Hypertension in his father and mother; Pulmonary fibrosis in his mother; Sarcoidosis in his mother.   Objective:   Vitals: BP 124/86 mmHg  Pulse 77  Temp(Src) 98.7 F (37.1 C) (Oral)  Resp 16  Ht 5' 10.75" (1.797 m)  Wt 199 lb 12.8 oz (90.629 kg)  BMI 28.07 kg/m2  SpO2 97%  Physical Exam  Constitutional: He is oriented to person, place, and time. He appears well-developed and well-nourished.  HENT:  Mouth/Throat: Oropharynx is clear and moist.  Eyes: Right eye exhibits no discharge. Left eye exhibits no discharge. No scleral icterus.  Cardiovascular: Normal rate, regular rhythm and intact distal pulses.  Exam reveals no gallop and no friction rub.   No murmur heard. Pulmonary/Chest: No respiratory distress. He has no wheezes. He has no rales.  Abdominal: Soft. Bowel sounds are normal. He exhibits distension. He exhibits no mass. There is tenderness  (RLQ>LLQ). There is rebound (over RLQ). There is no guarding.  Mild right sided CVA tenderness. Negative Rovsing, Psoas sign.  Neurological: He is alert and oriented to person, place, and time.  Skin: Skin is warm and dry.   Results for orders placed or performed in visit on 05/26/15 (from the past 24 hour(s))  POCT urinalysis dipstick     Status: None   Collection Time: 05/26/15  7:16 PM  Result Value Ref Range   Color, UA yellow yellow   Clarity, UA clear clear   Glucose, UA negative negative   Bilirubin, UA negative negative   Ketones, POC UA negative negative   Spec Grav, UA 1.010    Blood, UA negative negative   pH, UA 7.0    Protein Ur, POC negative negative   Urobilinogen, UA 0.2    Nitrite, UA Negative Negative   Leukocytes, UA Negative Negative   ECG interpretation - sinus rhythm.  Assessment and Plan :   1. Generalized abdominal pain 2. RLQ abdominal pain 3. LLQ pain 4. Loose stools 5. Right flank pain - Patient's progression of symptoms are worrisome for an acute process. Will send for CT abdomen especially since we are unable to do a POC CBC and progression of symptoms to diffuse abdominal pain, RLQ worst.  Jaynee Eagles, PA-C [facility] [phone] 05/26/2015 6:04 PM

## 2015-05-26 NOTE — Addendum Note (Signed)
Addended by: Marin Roberts on: 05/26/2015 07:41 PM   Modules accepted: Orders

## 2015-05-26 NOTE — Telephone Encounter (Signed)
Pt states Freida Busman told him to give a call back if his symptoms hadn't improved and he would like to speak with someone about it. Please call [phone]

## 2015-05-26 NOTE — Telephone Encounter (Signed)
I spoke to [employer] to get prior authorization for CT Abd/Pelvis with Contrast. Case [id number] has been approved  Josem Kaufmann [id number]

## 2015-05-26 NOTE — Patient Instructions (Signed)
I spoke to [employer] to get prior authorization for CT Abd/Pelvis with Contrast. Case [id number] has been approved  auth# [id number], has to be done at [facility]  Go to [facility] now register in ER for outpatient scan  Drink one bottle of contrast now and another in  One hour

## 2015-05-27 LAB — CBC WITH DIFFERENTIAL/PLATELET
BASOS ABS: 0.1 10*3/uL (ref 0.0–0.1)
BASOS PCT: 1 % (ref 0–1)
EOS PCT: 2 % (ref 0–5)
Eosinophils Absolute: 0.2 10*3/uL (ref 0.0–0.7)
HCT: 51.6 % (ref 39.0–52.0)
Hemoglobin: 18.4 g/dL — ABNORMAL HIGH (ref 13.0–17.0)
Lymphocytes Relative: 31 % (ref 12–46)
Lymphs Abs: 3.1 10*3/uL (ref 0.7–4.0)
MCH: 31.1 pg (ref 26.0–34.0)
MCHC: 35.7 g/dL (ref 30.0–36.0)
MCV: 87.2 fL (ref 78.0–100.0)
MPV: 9.7 fL (ref 8.6–12.4)
Monocytes Absolute: 0.6 10*3/uL (ref 0.1–1.0)
Monocytes Relative: 6 % (ref 3–12)
NEUTROS ABS: 6 10*3/uL (ref 1.7–7.7)
Neutrophils Relative %: 60 % (ref 43–77)
Platelets: 248 10*3/uL (ref 150–400)
RBC: 5.92 MIL/uL — AB (ref 4.22–5.81)
RDW: 13.4 % (ref 11.5–15.5)
WBC: 10 10*3/uL (ref 4.0–10.5)

## 2015-05-27 LAB — LIPASE: Lipase: 40 U/L (ref 7–60)

## 2015-05-27 NOTE — Telephone Encounter (Signed)
Mani,  Please see previous message what would you like [place] to do

## 2015-05-28 LAB — SEDIMENTATION RATE: Sed Rate: 1 mm/hr (ref 0–15)

## 2015-05-28 NOTE — Telephone Encounter (Signed)
Patient was seen in clinic on 05/26/2015.

## 2015-05-29 ENCOUNTER — Other Ambulatory Visit: Payer: Self-pay | Admitting: Urgent Care

## 2015-05-29 DIAGNOSIS — R195 Other fecal abnormalities: Secondary | ICD-10-CM

## 2015-05-29 DIAGNOSIS — K529 Noninfective gastroenteritis and colitis, unspecified: Secondary | ICD-10-CM

## 2015-05-29 DIAGNOSIS — R1084 Generalized abdominal pain: Secondary | ICD-10-CM

## 2015-05-29 LAB — STOOL CULTURE

## 2015-06-02 ENCOUNTER — Ambulatory Visit (INDEPENDENT_AMBULATORY_CARE_PROVIDER_SITE_OTHER): Payer: Managed Care, Other (non HMO) | Admitting: Gastroenterology

## 2015-06-02 ENCOUNTER — Encounter: Payer: Self-pay | Admitting: Gastroenterology

## 2015-06-02 ENCOUNTER — Other Ambulatory Visit: Payer: Managed Care, Other (non HMO)

## 2015-06-02 VITALS — BP 110/80 | HR 68 | Ht 70.75 in | Wt 200.0 lb

## 2015-06-02 DIAGNOSIS — K529 Noninfective gastroenteritis and colitis, unspecified: Secondary | ICD-10-CM

## 2015-06-02 DIAGNOSIS — K589 Irritable bowel syndrome without diarrhea: Secondary | ICD-10-CM

## 2015-06-02 MED ORDER — SUPREP BOWEL PREP KIT 17.5-3.13-1.6 GM/177ML PO SOLN: ORAL | Status: DC

## 2015-06-02 MED ORDER — DICYCLOMINE HCL 10 MG PO CAPS: 10.0000 mg | ORAL_CAPSULE | Freq: Three times a day (TID) | ORAL | Status: DC

## 2015-06-02 NOTE — Patient Instructions (Addendum)
  You have been scheduled for a colonoscopy. Please follow written instructions given to you at your visit today.  Please pick up your prep supplies at the pharmacy within the next 1-3 days. If you use inhalers (even only as needed), please bring them with you on the day of your procedure. Your physician has requested that you go to www.startemmi.com and enter the access code given to you at your visit today. This web site gives a general overview about your procedure. However, you should still follow specific instructions given to you by our office regarding your preparation for the procedure.   Your physician has requested that you go to the basement for the following lab work before leaving today: Stool studies  We have sent the following medications to your pharmacy for you to pick up at your convenience: Bentyl  Follow up with [place] in 2 months.  I appreciate the opportunity to care for you.

## 2015-06-02 NOTE — Progress Notes (Signed)
Mitchell Reed    [id number]    December 30, 1970  Primary Care Physician:Anne Robb Matar, NP  Referring Physician: Jaynee Eagles, PA-C [place], [place] [address]  Chief complaint: Diarrhea HPI: 45 year old male here with complaints of diarrhea progressively worse over the past few months. He can have up to 10-20 episodes a day, no mucus or blood. He is currently taking Imodium twice a day with 3-4 bowel movements a day. His symptoms started getting worse in the past 3-4 months. He reports being lactose intolerant and avoids milk and dairy products. He has cut back on the amount of soda he drinks, currently drinks 2 bottles of [facility] a day. He also drinks Kool-Aid and chews about a pack of gum a day. He also complained of abdominal bloating and diffuse pain with pressure. Denies any nausea or vomiting. No family history of colon cancer. His weight has been stable. Stool culture was negative. CT abdomen and pelvis did not show any thickening of GI tract.   Outpatient Encounter Prescriptions as of 06/02/2015  Medication Sig  . COLCRYS 0.6 MG tablet TAKE 1 TABLET (0.6 MG TOTAL) BY MOUTH 2 (TWO) TIMES DAILY   "OFFICE VISIT NEEDED FOR REFILLS   No facility-administered encounter medications on file as of 06/02/2015.    Allergies as of 06/02/2015  . (No Known Allergies)    Past Medical History  Diagnosis Date  . Gout     Past Surgical History  Procedure Laterality Date  . Vasectomy      Family History  Problem Relation Age of Onset  . Hypertension Mother   . COPD Mother   . Sarcoidosis Mother   . Pulmonary fibrosis Mother   . Hyperlipidemia Mother   . Diabetes Mother   . Heart disease Father   . Hyperlipidemia Father   . Hypertension Father   . Cancer Father   . Diabetes Brother     Social History   Social History  . Marital Status: Married    Spouse Name: Martin Majestic  . Number of Children: 3  . Years of Education: 11   Occupational History  . Truck  Geophysicist/field seismologist    Social History Main Topics  . Smoking status: Former Smoker -- 1.50 packs/day for 7 years    Quit date: 04/22/2014  . Smokeless tobacco: Current User    Types: Chew  . Alcohol Use: No  . Drug Use: No  . Sexual Activity:    Partners: Female    Patent examiner Protection: Surgical   Other Topics Concern  . Not on file   Social History Narrative   Divorced -    3 daughters - sole custody   Education - high school   Work - Administrator      Review of systems: Review of Systems  Constitutional: Negative for fever and chills.  HENT: Negative.   Eyes: Negative for blurred vision.  Respiratory: Negative for cough, shortness of breath and wheezing.   Cardiovascular: Negative for chest pain and palpitations.  Gastrointestinal: as per HPI Genitourinary: Negative for dysuria, urgency, frequency and hematuria.  Musculoskeletal: Negative for myalgias, back pain and joint pain.  Skin: Negative for itching and rash.  Neurological: Negative for dizziness, tremors, focal weakness, seizures and loss of consciousness.  Endo/Heme/Allergies: Negative for environmental allergies.  Psychiatric/Behavioral: Negative for depression, suicidal ideas and hallucinations.  All other systems reviewed and are negative.   Physical Exam: Filed Vitals:   06/02/15  1343  BP: 110/80  Pulse: 68   Gen:      No acute distress HEENT:  EOMI, sclera anicteric Neck:     No masses; no thyromegaly Lungs:    Clear to auscultation bilaterally; normal respiratory effort CV:         Regular rate and rhythm; no murmurs Abd:      + bowel sounds; soft, non-tender; no palpable masses, no distension Ext:    No edema; adequate peripheral perfusion Skin:      Warm and dry; no rash Neuro: alert and oriented x 3 Psych: normal mood and affect  Data Reviewed: CT abd & pelvis 05/26/15 The visualized lung bases are clear.  The liver and spleen are unremarkable in appearance. The gallbladder is within normal  limits. The pancreas and adrenal glands are unremarkable.  A 1.2 cm left renal cyst is noted, and scattered tiny additional bilateral renal cysts are also seen. There is no evidence of hydronephrosis. No renal or ureteral stones are seen. No perinephric stranding is appreciated.  No free fluid is identified. The small bowel is unremarkable in appearance. The stomach is within normal limits. No acute vascular abnormalities are seen.  The appendix is normal in caliber and contains contrast and air, without evidence of appendicitis. Contrast progresses to the level of the rectum. The colon is unremarkable in appearance.  The bladder is mildly distended and grossly unremarkable. Apparent bladder wall thickening likely reflects relative decompression. The prostate remains normal in size. No inguinal lymphadenopathy is seen.  No acute osseous abnormalities are identified.   Assessment and Plan/Recommendations: 45 year old male here for evaluation of chronic diarrhea associated with bloating and abdominal discomfort Likely functional diarrhea in the setting of excessive artificial sweeteners with non absorbable sugars We will schedule for colonoscopy with biopsies to rule out microscopic colitis versus IBD Advised patient to avoid all artificial sweeteners and soda Bentyl 10 mg as needed with meals and at bedtime Also check for stool C. Difficile though seems less likely Return in 2 months  K. Denzil Magnuson , MD [phone] Mon-Fri 8a-5p [phone] after 5p, weekends, holidays

## 2015-06-04 ENCOUNTER — Other Ambulatory Visit: Payer: No Typology Code available for payment source

## 2015-06-10 ENCOUNTER — Encounter: Payer: Self-pay | Admitting: *Deleted

## 2015-06-21 ENCOUNTER — Other Ambulatory Visit: Payer: Self-pay | Admitting: Gastroenterology

## 2015-07-08 ENCOUNTER — Encounter: Payer: Self-pay | Admitting: Gastroenterology

## 2015-07-08 ENCOUNTER — Ambulatory Visit (AMBULATORY_SURGERY_CENTER): Payer: Managed Care, Other (non HMO) | Admitting: Gastroenterology

## 2015-07-08 VITALS — BP 107/70 | HR 65 | Temp 98.0°F | Resp 18 | Ht 70.0 in | Wt 200.0 lb

## 2015-07-08 DIAGNOSIS — D121 Benign neoplasm of appendix: Secondary | ICD-10-CM

## 2015-07-08 DIAGNOSIS — K635 Polyp of colon: Secondary | ICD-10-CM

## 2015-07-08 DIAGNOSIS — K529 Noninfective gastroenteritis and colitis, unspecified: Secondary | ICD-10-CM | POA: Diagnosis not present

## 2015-07-08 MED ORDER — SODIUM CHLORIDE 0.9 % IV SOLN: 500.0000 mL | INTRAVENOUS | Status: DC

## 2015-07-08 MED ORDER — DICYCLOMINE HCL 10 MG PO CAPS: 10.0000 mg | ORAL_CAPSULE | Freq: Three times a day (TID) | ORAL | Status: AC

## 2015-07-08 NOTE — Op Note (Signed)
Mitchell Reed: Mitchell Reed Procedure Date: 07/08/2015 2:05 PM MRN: [id number] Endoscopist: Mauri Pole , MD Age: 45 Date of Birth: 1971-03-02 Gender: Male Procedure:                Colonoscopy Indications:              Clinically significant diarrhea of unexplained                            origin Medicines:                Monitored Anesthesia Care Procedure:                Pre-Anesthesia Assessment:                           - Prior to the procedure, a History and Physical                            was performed, and patient medications and                            allergies were reviewed. The patient's tolerance of                            previous anesthesia was also reviewed. The risks                            and benefits of the procedure and the sedation                            options and risks were discussed with the patient.                            All questions were answered, and informed consent                            was obtained. Prior Anticoagulants: The patient has                            taken no previous anticoagulant or antiplatelet                            agents. ASA Grade Assessment: II - A patient with                            mild systemic disease. After reviewing the risks                            and benefits, the patient was deemed in                            satisfactory condition to undergo the procedure.                           After  obtaining informed consent, the colonoscope                            was passed under direct vision. Throughout the                            procedure, the patient's blood pressure, pulse, and                            oxygen saturations were monitored continuously. The                            Model CF-HQ190L [id number]) scope was introduced                            through the anus and advanced to the the terminal                            ileum, with  identification of the appendiceal                            orifice and IC valve. The colonoscopy was performed                            without difficulty. The patient tolerated the                            procedure well. The quality of the bowel                            preparation was good. The terminal ileum, ileocecal                            valve, appendiceal orifice, and rectum were                            photographed. Scope In: 2:17:32 PM Scope Out: 2:34:47 PM Scope Withdrawal Time: 0 hours 12 minutes 21 seconds  Total Procedure Duration: 0 hours 17 minutes 15 seconds  Findings:                 The perianal and digital rectal examinations were                            normal. Pertinent negatives include normal                            sphincter tone.                           A 2 mm polyp was found in the appendiceal orifice.                            The polyp was sessile. The polyp was removed with a  cold snare. Resection and retrieval were complete.                           The exam was otherwise without abnormality.                           Biopsies for histology were taken with a cold                            forceps from the entire colon for evaluation of                            microscopic colitis. Complications:            No immediate complications. Estimated Blood Loss:     Estimated blood loss: none. Impression:               - One 2 mm polyp at the appendiceal orifice,                            removed with a cold snare. Resected and retrieved.                           - The examination was otherwise normal.                           - Biopsies were taken with a cold forceps from the                            entire colon for evaluation of microscopic colitis. Recommendation:           - Patient has a contact number available for                            emergencies. The signs and symptoms of potential                             delayed complications were discussed with the                            patient. Return to normal activities tomorrow.                            Written discharge instructions were provided to the                            patient.                           - Resume previous diet.                           - Continue present medications.                           - Await pathology results.                           -  Repeat colonoscopy is recommended. The                            colonoscopy date will be determined after pathology                            results from today's exam become available for                            review.                           - Return to GI clinic in 3 months. Mauri Pole, MD 07/08/2015 2:43:53 PM This report has been signed electronically.

## 2015-07-08 NOTE — Progress Notes (Signed)
Called to room to assist during endoscopic procedure.  Patient ID and intended procedure confirmed with present staff. Received instructions for my participation in the procedure from the performing physician.  

## 2015-07-08 NOTE — Patient Instructions (Signed)
YOU HAD AN ENDOSCOPIC PROCEDURE TODAY AT [facility]:   Refer to the procedure report that was given to you for any specific questions about what was found during the examination.  If the procedure report does not answer your questions, please call your gastroenterologist to clarify.  If you requested that your care partner not be given the details of your procedure findings, then the procedure report has been included in a sealed envelope for you to review at your convenience later.  YOU SHOULD EXPECT: Some feelings of bloating in the abdomen. Passage of more gas than usual.  Walking can help get rid of the air that was put into your GI tract during the procedure and reduce the bloating. If you had a lower endoscopy (such as a colonoscopy or flexible sigmoidoscopy) you may notice spotting of blood in your stool or on the toilet paper. If you underwent a bowel prep for your procedure, you may not have a normal bowel movement for a few days.  Please Note:  You might notice some irritation and congestion in your nose or some drainage.  This is from the oxygen used during your procedure.  There is no need for concern and it should clear up in a day or so.  SYMPTOMS TO REPORT IMMEDIATELY:   Following lower endoscopy (colonoscopy or flexible sigmoidoscopy):  Excessive amounts of blood in the stool  Significant tenderness or worsening of abdominal pains  Swelling of the abdomen that is new, acute  Fever of 100F or higher     For urgent or emergent issues, a gastroenterologist can be reached at any hour by calling [phone].   DIET: Your first meal following the procedure should be a small meal and then it is ok to progress to your normal diet. Heavy or fried foods are harder to digest and may make you feel nauseous or bloated.  Likewise, meals heavy in dairy and vegetables can increase bloating.  Drink plenty of fluids but you should avoid alcoholic beverages for 24  hours.  ACTIVITY:  You should plan to take it easy for the rest of today and you should NOT DRIVE or use heavy machinery until tomorrow (because of the sedation medicines used during the test).    FOLLOW UP: Our staff will call the number listed on your records the next business day following your procedure to check on you and address any questions or concerns that you may have regarding the information given to you following your procedure. If we do not reach you, we will leave a message.  However, if you are feeling well and you are not experiencing any problems, there is no need to return our call.  We will assume that you have returned to your regular daily activities without incident.  If any biopsies were taken you will be contacted by phone or by letter within the next 1-3 weeks.  Please call us at [phone] if you have not heard about the biopsies in 3 weeks.    SIGNATURES/CONFIDENTIALITY: You and/or your care partner have signed paperwork which will be entered into your electronic medical record.  These signatures attest to the fact that that the information above on your After Visit Summary has been reviewed and is understood.  Full responsibility of the confidentiality of this discharge information lies with you and/or your care-partner.  POLYP INFORMATION GIVEN.  AWAIT PATHOLOGY RESULTS.  FOLLOW-UP 3 MONTHS IN CLINIC WITH DR. NANDIGAM.

## 2015-07-08 NOTE — Progress Notes (Signed)
A/ox3 pleased with MAC, report to Jane RN 

## 2015-07-09 ENCOUNTER — Telehealth: Payer: Self-pay | Admitting: *Deleted

## 2015-07-09 NOTE — Telephone Encounter (Signed)
Message left to call with any questions or concerns. SM

## 2015-07-21 ENCOUNTER — Encounter: Payer: Self-pay | Admitting: Gastroenterology

## 2015-08-16 ENCOUNTER — Ambulatory Visit: Payer: Managed Care, Other (non HMO) | Admitting: Gastroenterology

## 2015-08-20 ENCOUNTER — Other Ambulatory Visit: Payer: Self-pay | Admitting: Gastroenterology

## 2016-01-28 ENCOUNTER — Other Ambulatory Visit: Payer: Self-pay

## 2016-01-28 MED ORDER — COLCRYS 0.6 MG PO TABS: ORAL_TABLET | ORAL | 0 refills | Status: DC

## 2016-03-15 ENCOUNTER — Telehealth: Payer: Self-pay

## 2016-03-15 NOTE — Telephone Encounter (Signed)
Pt is checking on the request for his [employer] number [phone]

## 2016-03-16 ENCOUNTER — Telehealth: Payer: Self-pay | Admitting: Emergency Medicine

## 2016-03-16 MED ORDER — COLCRYS 0.6 MG PO TABS: ORAL_TABLET | ORAL | 0 refills | Status: DC

## 2016-03-16 NOTE — Telephone Encounter (Signed)
Pt called in requesting medication refill on Colcrys 0.6 mg tab for leg pain  Refilled given for 1 month supply. Informed he will need to schedule office for further refills

## 2016-04-20 ENCOUNTER — Other Ambulatory Visit: Payer: Self-pay | Admitting: Physician Assistant

## 2016-04-21 NOTE — Telephone Encounter (Signed)
Last seen 05/2015

## 2016-04-21 NOTE — Telephone Encounter (Signed)
Patient needs an office visit. This medication is usually prescribed for gout but I see he hasn't had an office visit for this for more than a year for this. Also, if his pain is persisting despite taking this since 03/2016 (the last time it was refilled by phone), then he needs an OV.

## 2016-04-24 NOTE — Telephone Encounter (Signed)
Pt advised and tx up front for an appt 

## 2016-04-28 ENCOUNTER — Ambulatory Visit (INDEPENDENT_AMBULATORY_CARE_PROVIDER_SITE_OTHER): Payer: Managed Care, Other (non HMO) | Admitting: Physician Assistant

## 2016-04-28 VITALS — BP 124/88 | HR 90 | Temp 98.0°F | Resp 18 | Ht 70.0 in | Wt 196.0 lb

## 2016-04-28 DIAGNOSIS — Z130 Encounter for screening for diseases of the blood and blood-forming organs and certain disorders involving the immune mechanism: Secondary | ICD-10-CM | POA: Diagnosis not present

## 2016-04-28 DIAGNOSIS — R748 Abnormal levels of other serum enzymes: Secondary | ICD-10-CM | POA: Diagnosis not present

## 2016-04-28 DIAGNOSIS — Z113 Encounter for screening for infections with a predominantly sexual mode of transmission: Secondary | ICD-10-CM | POA: Diagnosis not present

## 2016-04-28 DIAGNOSIS — Z114 Encounter for screening for human immunodeficiency virus [HIV]: Secondary | ICD-10-CM

## 2016-04-28 DIAGNOSIS — E7800 Pure hypercholesterolemia, unspecified: Secondary | ICD-10-CM | POA: Diagnosis not present

## 2016-04-28 DIAGNOSIS — M109 Gout, unspecified: Secondary | ICD-10-CM

## 2016-04-28 DIAGNOSIS — Z0000 Encounter for general adult medical examination without abnormal findings: Secondary | ICD-10-CM

## 2016-04-28 MED ORDER — COLCRYS 0.6 MG PO TABS: ORAL_TABLET | ORAL | 11 refills | Status: AC

## 2016-04-28 NOTE — Progress Notes (Signed)
Mitchell Reed  MRN: [id number] DOB: Jun 26, 1970  Subjective:  Pt presents to clinic for a CPE.  He is doing well and having no concerns today.  He does need refills on his gout medications.  Last dental exam: every 6 months Last vision exam: never Colonoscopy - 2017 -  Vaccinations      Tetanus  Exercise: no outside of work Diet:cooks food - drinks mainly water  Patient Active Problem List   Diagnosis Date Noted  . Gout - 4 attacks in the last year     Current Outpatient Prescriptions on File Prior to Visit  Medication Sig Dispense Refill  . dicyclomine (BENTYL) 10 MG capsule Take 1 capsule (10 mg total) by mouth 4 (four) times daily -  before meals and at bedtime. 120 capsule 11   No current facility-administered medications on file prior to visit.     No Known Allergies  Social History   Social History  . Marital status: Married    Spouse name: Martin Majestic  . Number of children: 3  . Years of education: 11   Occupational History  . Truck Research scientist (medical)   Social History Main Topics  . Smoking status: Former Smoker    Packs/day: 1.50    Years: 7.00    Quit date: 04/22/2014  . Smokeless tobacco: Current User    Types: Chew  . Alcohol use No  . Drug use: No  . Sexual activity: Yes    Partners: Female    Birth control/ protection: Surgical   Other Topics Concern  . 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 VASECTOMY      Family History  Problem Relation Age of Onset  . Hypertension Mother   . COPD Mother   . Sarcoidosis Mother   . Pulmonary fibrosis Mother   . Hyperlipidemia Mother   . Diabetes Mother   . Heart disease Father   . Hyperlipidemia Father   . Hypertension Father   . Cancer Father   . Diabetes Brother     Review of Systems  Constitutional: Negative.   HENT:  Negative.   Eyes: Negative.   Respiratory: Negative.   Cardiovascular: Negative.   Gastrointestinal: Negative.   Endocrine: Negative.   Genitourinary: Negative.   Musculoskeletal: Negative.   Skin: Negative.   Allergic/Immunologic: Negative.   Neurological: Negative.   Hematological: Negative.   Psychiatric/Behavioral: Negative.     Objective:  BP 124/88   Pulse 90   Temp 98 F (36.7 C) (Oral)   Resp 18   Ht 5' 10"  (1.778 m)   Wt 196 lb (88.9 kg)   SpO2 99%   BMI 28.12 kg/m   Physical Exam  Constitutional: He is oriented to person, place, and time and well-developed, well-nourished, and in no distress.  HENT:  Head: Normocephalic and atraumatic.  Right Ear: Hearing, tympanic membrane, external ear and ear canal normal.  Left Ear: Hearing, tympanic membrane, external ear and ear canal normal.  Nose: Nose normal.  Mouth/Throat: Uvula is midline, oropharynx is clear and moist and mucous membranes are normal.  Eyes: Conjunctivae and EOM are normal. Pupils are equal, round, and reactive to light.  Neck: Trachea normal and normal range of motion. Neck supple. No thyroid mass and no  thyromegaly present.  Cardiovascular: Normal rate, regular rhythm and normal heart sounds.   No murmur heard. Pulmonary/Chest: Effort normal and breath sounds normal.  Abdominal: Soft. Bowel sounds are normal.  Musculoskeletal: Normal range of motion.  Neurological: He is alert and oriented to person, place, and time. Gait normal.  Skin: Skin is warm and dry.  Psychiatric: Mood, memory, affect and judgment normal.    Visual Acuity Screening   Right eye Left eye Both eyes  Without correction: 20/20 20/15 -1 20/13  With correction:       Assessment and Plan :  Annual physical exam - anticipatory guidance given  Elevated cholesterol - Plan: Lipid panel  Elevated liver enzymes - Plan: CMP14+EGFR  Screening for deficiency anemia - Plan: CBC with Differential/Platelet  Screening for HIV  (human immunodeficiency virus) - Plan: HIV antibody  Screen for STD (sexually transmitted disease) - Plan: GC/Chlamydia Probe Amp, RPR  Gout of knee, unspecified cause, unspecified chronicity, unspecified laterality - Plan: COLCRYS 0.6 MG tablet  Windell Hummingbird PA-C  [facility] 04/28/2016 9:42 AM

## 2016-04-28 NOTE — Patient Instructions (Addendum)
Please send a letter with the following and the lab results.  Keeping you healthy  Get these tests  Blood pressure- Have your blood pressure checked once a year by your healthcare provider.  Normal blood pressure is 120/80.  Weight- Have your body mass index (BMI) calculated to screen for obesity.  BMI is a measure of body fat based on height and weight. You can also calculate your own BMI at [employer]velB[employer].it.  Cholesterol- Have your cholesterol checked regularly starting at age 75, sooner may be necessary if you have diabetes, high blood pressure, if a family member developed heart diseases at an early age or if you smoke.   Chlamydia, HIV, and other sexual transmitted disease- Get screened each year until the age of 3 then within three months of each new sexual partner.  Diabetes- Have your blood sugar checked regularly if you have high blood pressure, high cholesterol, a family history of diabetes or if you are overweight.  Get these vaccines  Flu shot- Every fall.  Tetanus shot- Every 10 years.  Menactra- Single dose; prevents meningitis.  Take these steps  Don't smoke- If you do smoke, ask your healthcare provider about quitting. For tips on how to quit, go to www.smokefree.gov or call 1-800-QUIT-NOW.  Be physically active- Exercise 5 days a week for at least 30 minutes.  If you are not already physically active start slow and gradually work up to 30 minutes of moderate physical activity.  Examples of moderate activity include walking briskly, mowing the yard, dancing, swimming bicycling, etc.  Eat a healthy diet- Eat a variety of healthy foods such as fruits, vegetables, low fat milk, low fat cheese, yogurt, lean meats, poultry, fish, beans, tofu, etc.  For more information on healthy eating, go to www.thenutritionsource.org  Drink alcohol in moderation- Limit alcohol intake two drinks or less a day.  Never drink and drive.  Dentist- Brush and floss teeth twice  daily; visit your dentis twice a year.  Depression-Your emotional health is as important as your physical health.  If you're feeling down, losing interest in things you normally enjoy please talk with your healthcare provider.  Gun Safety- If you keep a gun in your home, keep it unloaded and with the safety lock on.  Bullets should be stored separately.  Helmet use- Always wear a helmet when riding a motorcycle, bicycle, rollerblading or skateboarding.  Safe sex- If you may be exposed to a sexually transmitted infection, use a condom  Seat belts- Seat bels can save your life; always wear one.  Smoke/Carbon Monoxide detectors- These detectors need to be installed on the appropriate level of your home.  Replace batteries at least once a year.  Skin Cancer- When out in the sun, cover up and use sunscreen SPF 15 or higher.  Violence- If anyone is threatening or hurting you, please tell your healthcare provider.    IF you received an x-ray today, you will receive an invoice from [facility]. Please contact [facility] at [phone] with questions or concerns regarding your invoice.   IF you received labwork today, you will receive an invoice from [place]. Please contact [facility] at [phone] with questions or concerns regarding your invoice.   Our billing staff will not be able to assist you with questions regarding bills from these companies.  You will be contacted with the lab results as soon as they are available. The fastest way to get your results is to activate your My Chart account. Instructions are located on  the last page of this paperwork. If you have not heard from [place] regarding the results in 2 weeks, please contact this office.

## 2016-04-29 LAB — CBC WITH DIFFERENTIAL/PLATELET
BASOS: 0 %
Basophils Absolute: 0 10*3/uL (ref 0.0–0.2)
EOS (ABSOLUTE): 0.3 10*3/uL (ref 0.0–0.4)
EOS: 3 %
HEMATOCRIT: 50.1 % (ref 37.5–51.0)
Hemoglobin: 18 g/dL — ABNORMAL HIGH (ref 13.0–17.7)
LYMPHS ABS: 3.1 10*3/uL (ref 0.7–3.1)
Lymphs: 36 %
MCH: 31.3 pg (ref 26.6–33.0)
MCHC: 35.9 g/dL — ABNORMAL HIGH (ref 31.5–35.7)
MCV: 87 fL (ref 79–97)
MONOS ABS: 0.4 10*3/uL (ref 0.1–0.9)
Monocytes: 5 %
NEUTROS PCT: 56 %
Neutrophils Absolute: 4.9 10*3/uL (ref 1.4–7.0)
PLATELETS: 253 10*3/uL (ref 150–379)
RBC: 5.75 x10E6/uL (ref 4.14–5.80)
RDW: 13.4 % (ref 12.3–15.4)
WBC: 8.7 10*3/uL (ref 3.4–10.8)

## 2016-04-29 LAB — CMP14+EGFR
ALBUMIN: 4.6 g/dL (ref 3.5–5.5)
ALT: 54 IU/L — ABNORMAL HIGH (ref 0–44)
AST: 37 IU/L (ref 0–40)
Albumin/Globulin Ratio: 1.6 (ref 1.2–2.2)
Alkaline Phosphatase: 75 IU/L (ref 39–117)
BUN/Creatinine Ratio: 7 — ABNORMAL LOW (ref 9–20)
BUN: 8 mg/dL (ref 6–24)
Bilirubin Total: 0.7 mg/dL (ref 0.0–1.2)
CO2: 23 mmol/L (ref 18–29)
CREATININE: 1.22 mg/dL (ref 0.76–1.27)
Calcium: 9.5 mg/dL (ref 8.7–10.2)
Chloride: 102 mmol/L (ref 96–106)
GFR calc Af Amer: 83 mL/min/{1.73_m2} (ref >59)
GFR, EST NON AFRICAN AMERICAN: 72 mL/min/{1.73_m2} (ref >59)
GLOBULIN, TOTAL: 2.8 g/dL (ref 1.5–4.5)
Glucose: 94 mg/dL (ref 65–99)
Potassium: 4 mmol/L (ref 3.5–5.2)
SODIUM: 142 mmol/L (ref 134–144)
Total Protein: 7.4 g/dL (ref 6.0–8.5)

## 2016-04-29 LAB — LIPID PANEL
CHOL/HDL RATIO: 7.9 ratio — AB (ref 0.0–5.0)
Cholesterol, Total: 262 mg/dL — ABNORMAL HIGH (ref 100–199)
HDL: 33 mg/dL — ABNORMAL LOW (ref >39)
LDL Calculated: 164 mg/dL — ABNORMAL HIGH (ref 0–99)
Triglycerides: 326 mg/dL — ABNORMAL HIGH (ref 0–149)
VLDL CHOLESTEROL CAL: 65 mg/dL — AB (ref 5–40)

## 2016-04-29 LAB — HIV ANTIBODY (ROUTINE TESTING W REFLEX): HIV SCREEN 4TH GENERATION: NONREACTIVE

## 2016-04-29 LAB — RPR: RPR: NONREACTIVE

## 2016-05-02 MED ORDER — ROSUVASTATIN CALCIUM 20 MG PO TABS: 20.0000 mg | ORAL_TABLET | Freq: Every day | ORAL | 0 refills | Status: DC

## 2016-05-02 NOTE — Addendum Note (Signed)
Addended by: Mancel Bale on: 05/02/2016 02:42 PM   Modules accepted: Orders

## 2016-05-03 LAB — GC/CHLAMYDIA PROBE AMP
Chlamydia trachomatis, NAA: NEGATIVE
Neisseria gonorrhoeae by PCR: NEGATIVE

## 2016-05-08 ENCOUNTER — Encounter: Payer: Self-pay | Admitting: *Deleted

## 2016-08-11 ENCOUNTER — Other Ambulatory Visit: Payer: Self-pay | Admitting: Physician Assistant

## 2016-08-11 DIAGNOSIS — E7800 Pure hypercholesterolemia, unspecified: Secondary | ICD-10-CM

## 2016-09-02 ENCOUNTER — Other Ambulatory Visit: Payer: Self-pay | Admitting: Gastroenterology

## 2016-09-02 DIAGNOSIS — K529 Noninfective gastroenteritis and colitis, unspecified: Secondary | ICD-10-CM

## 2016-12-08 ENCOUNTER — Other Ambulatory Visit: Payer: Self-pay | Admitting: Physician Assistant

## 2016-12-08 DIAGNOSIS — E7800 Pure hypercholesterolemia, unspecified: Secondary | ICD-10-CM

## 2017-01-13 ENCOUNTER — Other Ambulatory Visit: Payer: Self-pay | Admitting: Physician Assistant

## 2017-01-13 DIAGNOSIS — E7800 Pure hypercholesterolemia, unspecified: Secondary | ICD-10-CM

## 2017-02-25 ENCOUNTER — Other Ambulatory Visit: Payer: Self-pay | Admitting: Physician Assistant

## 2017-02-25 DIAGNOSIS — E7800 Pure hypercholesterolemia, unspecified: Secondary | ICD-10-CM

## 2017-05-24 ENCOUNTER — Other Ambulatory Visit: Payer: Self-pay | Admitting: Physician Assistant

## 2017-05-24 DIAGNOSIS — M109 Gout, unspecified: Secondary | ICD-10-CM

## 2017-05-24 NOTE — Telephone Encounter (Signed)
Called pt regarding prescription refill request; last office visit 04/28/16 and no uric acid noted in last year; also no upcoming appointments noted; left message at [phone].

## 2021-07-12 ENCOUNTER — Encounter: Payer: Self-pay | Admitting: Gastroenterology

## 2021-09-19 ENCOUNTER — Encounter: Payer: Self-pay | Admitting: Gastroenterology
